# Patient Record
Sex: Male | Born: 1960 | Race: White | Hispanic: No | Marital: Married | Smoking: Never smoker
Health system: Southern US, Community
[De-identification: ages and names within clinical notes are randomized; demographics above are authoritative.]

## PROBLEM LIST (undated history)

## (undated) DIAGNOSIS — E785 Hyperlipidemia, unspecified: Secondary | ICD-10-CM

## (undated) DIAGNOSIS — E663 Overweight: Secondary | ICD-10-CM

## (undated) DIAGNOSIS — I10 Essential (primary) hypertension: Secondary | ICD-10-CM

## (undated) DIAGNOSIS — R454 Irritability and anger: Secondary | ICD-10-CM

## (undated) HISTORY — DX: Essential (primary) hypertension: I10

## (undated) HISTORY — DX: Irritability and anger: R45.4

## (undated) HISTORY — DX: Hyperlipidemia, unspecified: E78.5

## (undated) HISTORY — DX: Overweight: E66.3

---

## 2003-05-04 HISTORY — PX: HERNIA REPAIR: SHX51

## 2003-07-23 ENCOUNTER — Ambulatory Visit (HOSPITAL_COMMUNITY): Admission: RE | Admit: 2003-07-23 | Discharge: 2003-07-23 | Payer: Self-pay | Admitting: Surgery

## 2004-09-02 ENCOUNTER — Encounter: Admission: RE | Admit: 2004-09-02 | Discharge: 2004-09-02 | Payer: Self-pay | Admitting: Orthopedic Surgery

## 2004-11-05 ENCOUNTER — Ambulatory Visit: Payer: Self-pay | Admitting: Family Medicine

## 2004-11-30 ENCOUNTER — Ambulatory Visit: Payer: Self-pay | Admitting: Family Medicine

## 2004-12-09 ENCOUNTER — Ambulatory Visit: Payer: Self-pay | Admitting: Family Medicine

## 2004-12-17 ENCOUNTER — Ambulatory Visit: Payer: Self-pay | Admitting: Family Medicine

## 2005-01-28 ENCOUNTER — Ambulatory Visit: Payer: Self-pay | Admitting: Family Medicine

## 2005-04-26 ENCOUNTER — Ambulatory Visit: Payer: Self-pay | Admitting: Family Medicine

## 2005-06-21 ENCOUNTER — Ambulatory Visit: Payer: Self-pay | Admitting: Family Medicine

## 2005-10-28 ENCOUNTER — Ambulatory Visit: Payer: Self-pay | Admitting: Family Medicine

## 2005-12-30 ENCOUNTER — Ambulatory Visit: Payer: Self-pay | Admitting: Family Medicine

## 2006-02-01 ENCOUNTER — Ambulatory Visit: Payer: Self-pay | Admitting: Family Medicine

## 2006-02-08 ENCOUNTER — Ambulatory Visit: Payer: Self-pay | Admitting: Family Medicine

## 2006-02-08 LAB — CONVERTED CEMR LAB
ALT: 51 U/L — ABNORMAL HIGH
AST: 39 U/L — ABNORMAL HIGH
Albumin: 3.5 g/dL
Alkaline Phosphatase: 52 U/L
BUN: 15 mg/dL
Bilirubin, Direct: 0.1 mg/dL
CO2: 27 meq/L
Calcium: 8.9 mg/dL
Chloride: 109 meq/L
Cholesterol: 162 mg/dL
Creatinine, Ser: 0.9 mg/dL
GFR calc Af Amer: 118 mL/min
GFR calc non Af Amer: 97 mL/min
Glucose, Bld: 95 mg/dL
HDL: 29 mg/dL — ABNORMAL LOW
LDL Cholesterol: 105 mg/dL — ABNORMAL HIGH
Potassium: 3.9 meq/L
Sodium: 141 meq/L
TSH: 0.71 u[IU]/mL
Total Bilirubin: 0.6 mg/dL
Total CHOL/HDL Ratio: 5.6
Total Protein: 6.6 g/dL
Triglycerides: 142 mg/dL
VLDL: 28 mg/dL

## 2006-02-10 ENCOUNTER — Ambulatory Visit: Payer: Self-pay | Admitting: Family Medicine

## 2006-03-03 ENCOUNTER — Ambulatory Visit: Payer: Self-pay | Admitting: Family Medicine

## 2006-04-20 ENCOUNTER — Encounter: Payer: Self-pay | Admitting: Family Medicine

## 2006-04-20 DIAGNOSIS — I10 Essential (primary) hypertension: Secondary | ICD-10-CM | POA: Insufficient documentation

## 2006-04-20 DIAGNOSIS — T148XXA Other injury of unspecified body region, initial encounter: Secondary | ICD-10-CM | POA: Insufficient documentation

## 2006-04-20 DIAGNOSIS — K449 Diaphragmatic hernia without obstruction or gangrene: Secondary | ICD-10-CM | POA: Insufficient documentation

## 2006-04-20 DIAGNOSIS — R7303 Prediabetes: Secondary | ICD-10-CM

## 2006-04-20 DIAGNOSIS — R030 Elevated blood-pressure reading, without diagnosis of hypertension: Secondary | ICD-10-CM

## 2006-04-20 DIAGNOSIS — R454 Irritability and anger: Secondary | ICD-10-CM

## 2006-05-10 ENCOUNTER — Ambulatory Visit: Payer: Self-pay | Admitting: Family Medicine

## 2006-05-10 LAB — CONVERTED CEMR LAB
ALT: 25 units/L (ref 0–40)
AST: 23 units/L (ref 0–37)
Albumin: 4 g/dL (ref 3.5–5.2)
Alkaline Phosphatase: 60 units/L (ref 39–117)
Total Bilirubin: 0.9 mg/dL (ref 0.3–1.2)

## 2006-05-12 ENCOUNTER — Ambulatory Visit: Payer: Self-pay | Admitting: Family Medicine

## 2007-04-18 ENCOUNTER — Ambulatory Visit: Payer: Self-pay | Admitting: Family Medicine

## 2007-10-30 ENCOUNTER — Telehealth: Payer: Self-pay | Admitting: Family Medicine

## 2007-11-11 IMAGING — CR DG RIBS 2V*R*
1 series · 3 of 3 positions shown · non-contrast
Comparison: none

REASON FOR EXAM: INJURY (TELEPHONE RESULTS TO PHYSICIAN)
COMMENTS:

PROCEDURE:     DXR - DXR RIBS RIGHT UNILATERAL  - November 30, 2004 [DATE]
RESULT:         Evaluation of the RIGHT ribs demonstrates no evidence of
fracture, dislocation or malalignment.   RIGHT hemithorax appears
unremarkable.

[Series 1: view not recorded · 0.17mm/px · 3 of 3 slices shown]
[im 1/3]
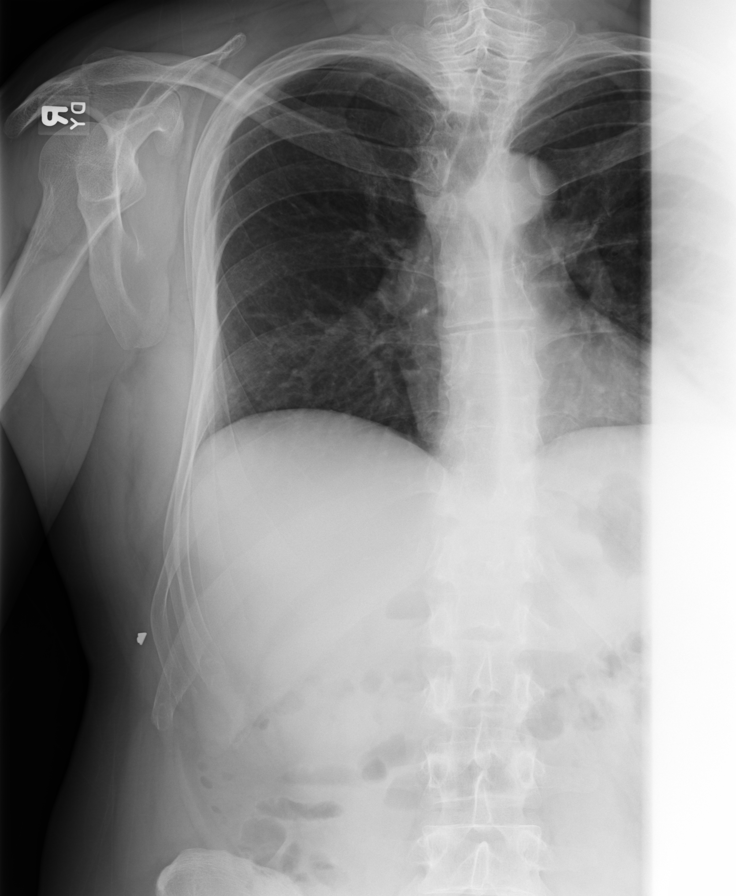
[im 2/3]
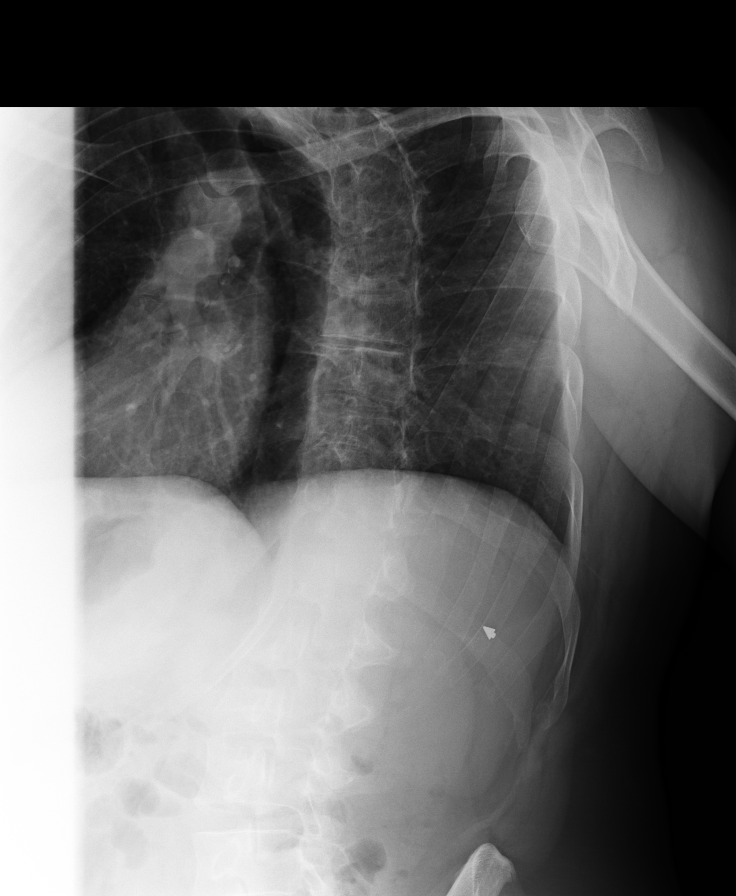
[im 3/3]
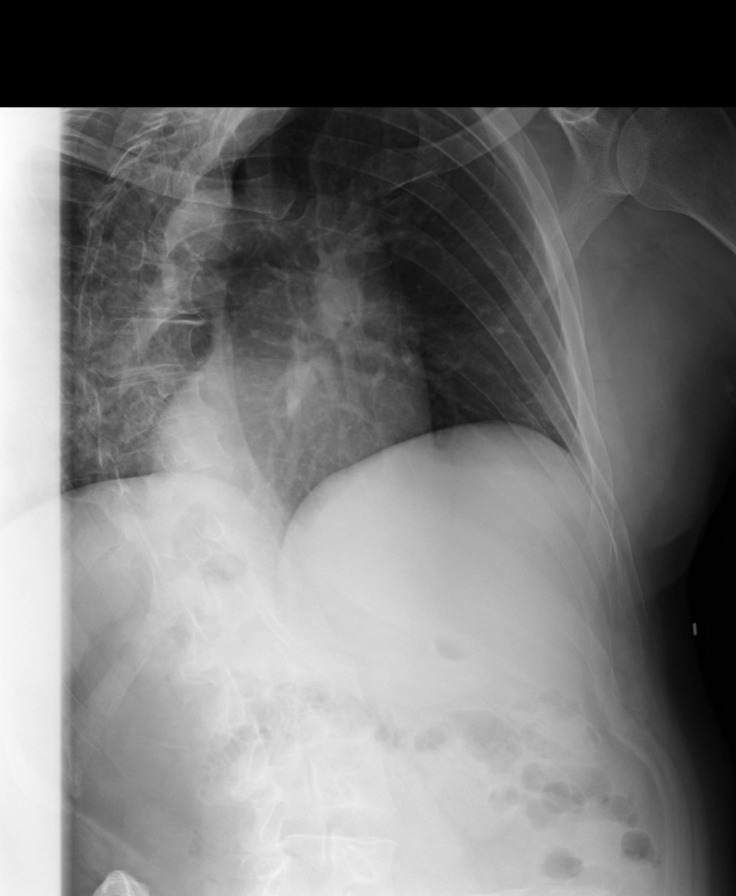

[3 of 3 positions shown; findings below may reference images not displayed]

IMPRESSION: 1.     Unremarkable unilateral RIGHT ribs.
2.     If there is persistent complaints of pain or persistent clinical
concern, a repeat evaluation in 7-10 days is recommended if clinically
warranted.

## 2007-12-19 ENCOUNTER — Telehealth: Payer: Self-pay | Admitting: Family Medicine

## 2008-05-09 ENCOUNTER — Telehealth (INDEPENDENT_AMBULATORY_CARE_PROVIDER_SITE_OTHER): Payer: Self-pay | Admitting: *Deleted

## 2008-05-21 ENCOUNTER — Ambulatory Visit: Payer: Self-pay | Admitting: Family Medicine

## 2008-05-21 LAB — CONVERTED CEMR LAB
Glucose, Urine, Semiquant: NEGATIVE
Ketones, urine, test strip: NEGATIVE
Specific Gravity, Urine: 1.02
Urobilinogen, UA: 0.2
WBC Urine, dipstick: NEGATIVE
pH: 6.5

## 2008-11-04 ENCOUNTER — Ambulatory Visit: Payer: Self-pay | Admitting: Family Medicine

## 2008-11-04 DIAGNOSIS — K5289 Other specified noninfective gastroenteritis and colitis: Secondary | ICD-10-CM

## 2008-11-21 ENCOUNTER — Telehealth: Payer: Self-pay | Admitting: Family Medicine

## 2008-11-26 ENCOUNTER — Ambulatory Visit: Payer: Self-pay | Admitting: Family Medicine

## 2008-11-26 DIAGNOSIS — N529 Male erectile dysfunction, unspecified: Secondary | ICD-10-CM

## 2009-01-15 ENCOUNTER — Telehealth: Payer: Self-pay | Admitting: Family Medicine

## 2009-08-11 ENCOUNTER — Encounter (INDEPENDENT_AMBULATORY_CARE_PROVIDER_SITE_OTHER): Payer: Self-pay | Admitting: *Deleted

## 2009-10-21 ENCOUNTER — Telehealth: Payer: Self-pay | Admitting: Family Medicine

## 2010-02-02 NOTE — Letter (Signed)
Summary: Nadara Eaton letter  [place] [facility]  [address] [place], [place] [address]   Phone: [phone]  Fax: [phone]       08/11/2009 MRN: [id number]  Samuel Harrington [address] [place], [place]  [address]  Dear Mr. Samuel Harrington [facility] [place] and [place] announce the retirement of Arta Silence, M.D., from full-time practice at the [facility] office effective July 02, 2009 and his plans of returning part-time.  It is important to Dr. Hetty Ely and to our practice that you understand that [facility] has seven physicians in our office for your health care needs.  We will continue to offer the same exceptional care that you have today.    Dr. Hetty Ely has spoken to many of you about his plans for retirement and returning part-time in the fall.   We will continue to work with you through the transition to schedule appointments for you in the office and meet the high standards that [place] is committed to.   Again, it is with great pleasure that we share the news that Dr. Hetty Ely will return to [facility] in October of 2011 with a reduced schedule.    If you have any questions, or would like to request an appointment with one of our physicians, please call us at [phone] and press the option for Scheduling an appointment.  We take pleasure in providing you with excellent patient care and look forward to seeing you at your next office visit.  Our [facility] Physicians are:  Tillman Abide, M.D. Laurita Quint, M.D. Roxy Manns, M.D. Kerby Nora, M.D. Hannah Beat, M.D. Ruthe Mannan, M.D. We proudly welcomed Raechel Ache, M.D. and Eustaquio Boyden, M.D. to the practice in July/August 2011.  Sincerely,  [place] [facility]

## 2010-02-02 NOTE — Letter (Signed)
Summary: Schaller Retirement letter  Samuel Harrington at [place]  [address]   [place], [place] [address]   Phone: [phone]  Fax: [phone]       08/11/2009 MRN: [id number]  [name] [address] [place], [place]  [address]  Dear [name],  [place] [facility] [place], and  announce the retirement of ROBERT N. SCHALLER, M.D., from full-time practice at the [place] office effective July 02, 2009 and his plans of returning part-time.  It is important to Dr. Schaller and to our practice that you understand that [place] [facility] [place] has seven physicians in our office for your health care needs.  We will continue to offer the same exceptional care that you have today.    Dr. Schaller has spoken to many of you about his plans for retirement and returning part-time in the fall.   We will continue to work with you through the transition to schedule appointments for you in the office and meet the high standards that [place] is committed to.   Again, it is with great pleasure that we share the news that Dr. Schaller will return to [facility] [place] in October of 2011 with a reduced schedule.    If you have any questions, or would like to request an appointment with one of our physicians, please call us at [phone] and press the option for Scheduling an appointment.  We take pleasure in providing you with excellent patient care and look forward to seeing you at your next office visit.  Our [place] Physicians are:  Richard Letvak, M.D. Robert Schaller, M.D. Marne Tower, M.D. Amy Bedsole, M.D. Spencer Copland, M.D. Talia Aron, M.D. We proudly welcomed Shaw Duncan, M.D. and Javier Gutierrez, M.D. to the practice in July/August 2011.  Sincerely,   [facility] [place] 

## 2010-02-02 NOTE — Progress Notes (Signed)
Summary: RX Viagra  Phone Note Call from Patient   Caller: Patient Call For: Shaune Leeks MD Summary of Call: Patient was in a few months and was given a rx for Viagra. Patient states that he has been checking around for the cheapest price and now has misplaced the rx and request that another be written so that he can pick it up.  Initial call taken by: Sydell Axon LPN,  January 15, 2009 10:48 AM  Follow-up for Phone Call        printed in put in nurse in box for pickup will refil times one in Dr Kathie Rhodes absence   Follow-up by: Judith Part MD,  January 15, 2009 11:38 AM  Additional Follow-up for Phone Call Additional follow up Details #1::        Advised pt ok to pick up. Additional Follow-up by: Lowella Petties CMA,  January 15, 2009 12:05 PM    Prescriptions: VIAGRA 100 MG TABS (SILDENAFIL CITRATE) one tab by mouth one hour prior to desired activity.  #10 x 0   Entered and Authorized by:   Judith Part MD   Signed by:   Judith Part MD on 01/15/2009   Method used:   Print then Give to Patient   RxID:   [id number]

## 2010-02-02 NOTE — Progress Notes (Signed)
Summary: refill request for viagra  Phone Note Refill Request Message from:  Fax from Pharmacy  Refills Requested: Medication #1:  VIAGRA 100 MG TABS one tab by mouth one hour prior to desired activity..   Last Refilled: 06/30/2009 Faxed request from [facility].  Initial call taken by: Lowella Petties CMA,  October 21, 2009 4:02 PM  Follow-up for Phone Call        Pls have pt schedule Comp Exam when able. No further meds until seen. Follow-up by: Shaune Leeks MD,  October 21, 2009 4:22 PM  Additional Follow-up for Phone Call Additional follow up Details #1::        Patient notified as instructed by telephone. Patient declined to schedule an appointment at this time and stated that he will have to call back to get this scheduled. Additional Follow-up by: Sydell Axon LPN,  October 22, 2009 2:52 PM    Prescriptions: VIAGRA 100 MG TABS (SILDENAFIL CITRATE) one tab by mouth one hour prior to desired activity.  #10 x 0   Entered and Authorized by:   Shaune Leeks MD   Signed by:   Shaune Leeks MD on 10/21/2009   Method used:   Electronically to        [facility] (retail)       [address] [place], [place]  [address]       Ph: [phone]       Fax: [phone]   RxID:   [id number]

## 2010-02-09 ENCOUNTER — Encounter: Payer: Self-pay | Admitting: Family Medicine

## 2010-03-31 ENCOUNTER — Ambulatory Visit (INDEPENDENT_AMBULATORY_CARE_PROVIDER_SITE_OTHER): Payer: PRIVATE HEALTH INSURANCE | Admitting: Family Medicine

## 2010-03-31 ENCOUNTER — Encounter: Payer: Self-pay | Admitting: Family Medicine

## 2010-03-31 DIAGNOSIS — Z0000 Encounter for general adult medical examination without abnormal findings: Secondary | ICD-10-CM

## 2010-03-31 DIAGNOSIS — I10 Essential (primary) hypertension: Secondary | ICD-10-CM

## 2010-03-31 DIAGNOSIS — IMO0002 Reserved for concepts with insufficient information to code with codable children: Secondary | ICD-10-CM

## 2010-03-31 DIAGNOSIS — K449 Diaphragmatic hernia without obstruction or gangrene: Secondary | ICD-10-CM

## 2010-03-31 DIAGNOSIS — R7989 Other specified abnormal findings of blood chemistry: Secondary | ICD-10-CM

## 2010-03-31 NOTE — Assessment & Plan Note (Signed)
Minmal sxs. Again wt loss will help.

## 2010-03-31 NOTE — Progress Notes (Signed)
Addended byMills Koller on: 03/31/2010 03:49 PM   Modules accepted: Orders

## 2010-03-31 NOTE — Patient Instructions (Signed)
Lose weight. Watch sweets and carbs.  Will report labs on phone tree.

## 2010-03-31 NOTE — Assessment & Plan Note (Signed)
Will recheck this PM randomly. Assume it will be high and require fasting BS with A1C.
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
## 2010-03-31 NOTE — Assessment & Plan Note (Signed)
Mildly elevated. Wt loss will help. He is committed to weight loss so will leave his BP medications alone. BP Readings from Last 3 Encounters:  03/31/10 120/90  11/26/08 120/72  11/04/08 124/88
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
## 2010-04-01 LAB — COMPREHENSIVE METABOLIC PANEL
AST: 30 U/L (ref 0–37)
Albumin: 4.3 g/dL (ref 3.5–5.2)
CO2: 28 mEq/L (ref 19–32)

## 2010-04-01 LAB — TSH: TSH: 1.32 u[IU]/mL (ref 0.35–5.50)

## 2010-04-01 LAB — LDL CHOLESTEROL, DIRECT: Direct LDL: 182.2 mg/dL

## 2010-04-01 LAB — LIPID PANEL: Total CHOL/HDL Ratio: 6

## 2010-05-21 NOTE — Op Note (Signed)
NAME:  Samuel Harrington, Samuel Harrington                        ACCOUNT NO.:  [id number]   MEDICAL RECORD NO.:  [id number]                   PATIENT TYPE:  AMB   LOCATION:  DAY                                  FACILITY[facility]  [facility]   PHYSICIAN:  Thornton Park. Daphine Deutscher, M.D.             DATE OF BIRTH:  1960/01/15   DATE OF PROCEDURE:  07/23/2003  DATE OF DISCHARGE:                                 OPERATIVE REPORT   PREOPERATIVE DIAGNOSES:  Bilateral inguinal hernias left greater than right.   POSTOPERATIVE DIAGNOSES:  Bilateral direct inguinal hernias left greater  than right.   PROCEDURE:  Laparoscopic preperitoneal dissection to reduce direct hernias,  repair with bard 3D max mesh (large).   DESCRIPTION OF PROCEDURE:  Mr. Nam Vossler was taken to room 11 on July 23, 2003 and given general anesthesia. The abdomen was prepped with Betadine  including the scrotum, testes and draped sterilely. A transverse incision  was made slightly to the left in the infraumbilical region. The anterior  rectus sheath was divided longitudinally in the anterior portion and the  muscle was retracted laterally.  With my fifth digit, I dissected along the  medial aspect of the rectus sheath down toward the pubis and then inserted  the balloon which I then deployed using a zero degree scope and got  dissection that dissect the rectus muscles apart a bit and seemed to get a  nice dissection in the preperitoneal space. The patient does have a moderate  amount of preperitoneal fat which obscured his pubis initially but I was  able to subsequently put two 5 mm trocars in and begin a dissection first on  the left side dissecting down a lot of preperitoneal fat.  This was done  using the epigastric vessels as an orientation and in doing so removed a  fatty mass that seemed to be within a fairly large direct sac that I could  see and when I separated it tended to retract up into the hernia defect. The  herniated material was brought  back into the preperitoneal space. I then  went out laterally and got as far as I could feel to the anterior iliac  spine and I got around the cord structures and skeletonized this and did not  see an indirect hernia. I was able to get around that.  I then went across  to the right side and dissected that area as well and went laterally.  He  did have a lot of fatty material showing some old inflammatory reaction  again much on the left as on the right and this was swept away laterally out  toward the anterior superior iliac spine.  I did not seem to get into the  abdominal cavity at all.  There was some leakage around the port site  proximally and I used some Vaseline gauze to try to help maintain a good  pneumo within the preperitoneal space.  The cord structures were again  identified and skeletonized. On the right side, I could see the femoral  vessels nicely.  After reducing the fat and demonstrating a direct hernia on  the right which was somewhat smaller, I then went ahead and repaired the  left side with a piece of large 3D max mesh. This did, because of the size  of his pelvis, seem to overlap the midline slightly.  I tacked the medial  and the pubis and then went down and tacked it with three tacks along the  pubis.  I laid it out laterally but did not tack it near the lateral most  portion but tacked it anteriorly in the midline.  Similarly a piece of right  3D max mesh was inserted and this seemed to again overlap the previously  placed mesh and I felt at this point his pelvis was somewhat smaller.  This  was taken out laterally as far as possible going over the cord structures  but mainly but both mesh generously covered the direct defect. These were  tacked again at this point anteriorly up the midline and anteriorly.  It  seemed to secure the mesh adequately and it seemed to cover the direct  defects nicely.  Both testes were palpated and were in good position.  There  was no  pneumoperitoneum in the scrotum at all and the palpable area that I  could palpate in the anterior abdominal wall corresponded to the direct  defect seen.  No bleeding was seen. I injected the port site with Marcaine.  I decompressed the abdomen and withdrew the trocars. The defect in the  midline rectus was repaired with a figure-of-eight suture of  #0 Vicryl.  The wounds were closed with #0 Vicryl, Benzoin and Steri-Strips.  The patient seemed to tolerate the procedure well and was taken to the  recovery room. He was given Percocet prescription to take for pain and will  be followed up in the office in 2-3 weeks.                                               Thornton Park Daphine Deutscher, M.D.    MBM/MEDQ  D:  07/23/2003  T:  07/23/2003  Job:  [id number]   cc:   Laurita Quint, M.D.  [address] [place]  [place] [address]  Fax: [phone]

## 2010-12-31 ENCOUNTER — Other Ambulatory Visit: Payer: Self-pay | Admitting: Family Medicine

## 2011-01-03 NOTE — Telephone Encounter (Signed)
Is it okay to refill medication? 

## 2011-01-07 ENCOUNTER — Telehealth: Payer: Self-pay | Admitting: Family Medicine

## 2011-01-07 NOTE — Telephone Encounter (Signed)
Wife requested refill for Viagra at [facility] and they said they have not received refill.  Please call back [phone]

## 2011-01-07 NOTE — Telephone Encounter (Signed)
Duplicate.  This was sent electronically on 12/31/10

## 2011-02-28 ENCOUNTER — Other Ambulatory Visit: Payer: Self-pay | Admitting: *Deleted

## 2011-02-28 MED ORDER — SERTRALINE HCL 100 MG PO TABS: ORAL_TABLET | ORAL | Status: DC

## 2011-02-28 NOTE — Telephone Encounter (Signed)
Asher Muir please schedule with Dr Sharen Hones to get pt established. Thank you.

## 2011-02-28 NOTE — Telephone Encounter (Signed)
Received faxed refill request, patient has not established with another physician here.  Please advise.

## 2011-02-28 NOTE — Telephone Encounter (Signed)
Will refil - please make appt to get him est with Dr D or Reece Agar

## 2011-03-01 ENCOUNTER — Other Ambulatory Visit: Payer: Self-pay | Admitting: *Deleted

## 2011-03-01 MED ORDER — SERTRALINE HCL 100 MG PO TABS: ORAL_TABLET | ORAL | Status: DC

## 2011-03-01 NOTE — Telephone Encounter (Signed)
Patient notified as instructed by telephone. Pt said he wanted med called to [facility] and pt will call back to establish with one of the new doctors.Medication phoned to [facility] pharmacy as instructed.

## 2011-03-01 NOTE — Telephone Encounter (Signed)
Patient called and stated that the Rx he requested for Zoloft was suppose to be called in for a 90 day supply and we only authorized one refill.  Ok to give additional refills?  Please advise.

## 2011-03-01 NOTE — Telephone Encounter (Signed)
L/M for pt to call back and schedule appt to est with Dr. Reece Agar

## 2011-03-01 NOTE — Telephone Encounter (Signed)
Sorry, he was Dr Lorenza Chick pt and I did not know this  # 90 is ok with 1 refil  Correct me if I'm wrong- last visit was 3/12 and he needs to est with a new doctor - that is why I did not give refils  Go ahead and give 90 with 1 ref and sched appt to est with new physician (Dr Reece Agar or D) when able  Thanks

## 2011-07-26 ENCOUNTER — Other Ambulatory Visit: Payer: Self-pay | Admitting: *Deleted

## 2011-07-27 MED ORDER — SERTRALINE HCL 100 MG PO TABS: ORAL_TABLET | ORAL | Status: DC

## 2011-07-27 NOTE — Telephone Encounter (Signed)
Have sent in.  Needs office visit /physical prior to more refills.

## 2011-08-18 ENCOUNTER — Encounter: Payer: Self-pay | Admitting: Family Medicine

## 2011-08-18 ENCOUNTER — Ambulatory Visit (INDEPENDENT_AMBULATORY_CARE_PROVIDER_SITE_OTHER): Payer: PRIVATE HEALTH INSURANCE | Admitting: Family Medicine

## 2011-08-18 VITALS — BP 134/82 | HR 80 | Temp 97.9°F | Ht 70.0 in | Wt 210.2 lb

## 2011-08-18 DIAGNOSIS — R7989 Other specified abnormal findings of blood chemistry: Secondary | ICD-10-CM

## 2011-08-18 DIAGNOSIS — E669 Obesity, unspecified: Secondary | ICD-10-CM | POA: Insufficient documentation

## 2011-08-18 DIAGNOSIS — Z1211 Encounter for screening for malignant neoplasm of colon: Secondary | ICD-10-CM

## 2011-08-18 DIAGNOSIS — N529 Male erectile dysfunction, unspecified: Secondary | ICD-10-CM

## 2011-08-18 DIAGNOSIS — E66811 Obesity, class 1: Secondary | ICD-10-CM | POA: Insufficient documentation

## 2011-08-18 DIAGNOSIS — R454 Irritability and anger: Secondary | ICD-10-CM

## 2011-08-18 DIAGNOSIS — IMO0002 Reserved for concepts with insufficient information to code with codable children: Secondary | ICD-10-CM

## 2011-08-18 DIAGNOSIS — I10 Essential (primary) hypertension: Secondary | ICD-10-CM

## 2011-08-18 DIAGNOSIS — E785 Hyperlipidemia, unspecified: Secondary | ICD-10-CM

## 2011-08-18 DIAGNOSIS — E663 Overweight: Secondary | ICD-10-CM

## 2011-08-18 MED ORDER — SERTRALINE HCL 100 MG PO TABS: ORAL_TABLET | ORAL | Status: DC

## 2011-08-18 MED ORDER — SILDENAFIL CITRATE 100 MG PO TABS: 100.0000 mg | ORAL_TABLET | ORAL | Status: DC | PRN

## 2011-08-18 NOTE — Patient Instructions (Signed)
Good to meet you today! Call us with questions. meds refilled today. Return at your convenience fasting for blood work, afterwards for physical. Pass by lab for stool kit for colon cancer screening.

## 2011-08-18 NOTE — Assessment & Plan Note (Signed)
Great control on SSRI - desires to continue.

## 2011-08-18 NOTE — Assessment & Plan Note (Signed)
Normal last blood work.

## 2011-08-18 NOTE — Assessment & Plan Note (Signed)
Will check next fasting blood work, discussed low chol diet.
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
## 2011-08-18 NOTE — Assessment & Plan Note (Signed)
Borderline in past.  Stable today off meds.

## 2011-08-18 NOTE — Assessment & Plan Note (Signed)
Chronic, stable. Refilled med.

## 2011-08-18 NOTE — Assessment & Plan Note (Signed)
Will continue to encourage weight loss through diet/exercise.

## 2012-04-10 ENCOUNTER — Ambulatory Visit (INDEPENDENT_AMBULATORY_CARE_PROVIDER_SITE_OTHER): Payer: Managed Care, Other (non HMO) | Admitting: Family Medicine

## 2012-04-10 ENCOUNTER — Encounter: Payer: Self-pay | Admitting: Family Medicine

## 2012-04-10 VITALS — BP 138/80 | HR 60 | Temp 98.2°F | Ht 69.0 in | Wt 214.2 lb

## 2012-04-10 DIAGNOSIS — E785 Hyperlipidemia, unspecified: Secondary | ICD-10-CM

## 2012-04-10 DIAGNOSIS — Z1211 Encounter for screening for malignant neoplasm of colon: Secondary | ICD-10-CM

## 2012-04-10 DIAGNOSIS — R61 Generalized hyperhidrosis: Secondary | ICD-10-CM | POA: Insufficient documentation

## 2012-04-10 DIAGNOSIS — I10 Essential (primary) hypertension: Secondary | ICD-10-CM

## 2012-04-10 DIAGNOSIS — R7989 Other specified abnormal findings of blood chemistry: Secondary | ICD-10-CM

## 2012-04-10 DIAGNOSIS — Z0000 Encounter for general adult medical examination without abnormal findings: Secondary | ICD-10-CM | POA: Insufficient documentation

## 2012-04-10 NOTE — Assessment & Plan Note (Signed)
Check blood work today.
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
## 2012-04-10 NOTE — Assessment & Plan Note (Signed)
Preventative protocols reviewed and updated unless pt declined. Discussed healthy diet and lifestyle.  

## 2012-04-10 NOTE — Assessment & Plan Note (Signed)
Recheck today. 

## 2012-04-10 NOTE — Assessment & Plan Note (Signed)
Stable off meds. ?

## 2012-04-10 NOTE — Patient Instructions (Addendum)
Blood work today. Tetanus shot will be due 2017. Good to see you today, call us with questions.

## 2012-04-11 LAB — COMPREHENSIVE METABOLIC PANEL
ALT: 37 U/L (ref 0–53)
Alkaline Phosphatase: 57 U/L (ref 39–117)
BUN: 11 mg/dL (ref 6–23)
CO2: 24 mEq/L (ref 19–32)
Calcium: 9 mg/dL (ref 8.4–10.5)
Creatinine, Ser: 0.8 mg/dL (ref 0.4–1.5)
Potassium: 3.7 mEq/L (ref 3.5–5.1)
Total Bilirubin: 0.7 mg/dL (ref 0.3–1.2)
Total Protein: 7 g/dL (ref 6.0–8.3)

## 2012-04-11 LAB — CBC WITH DIFFERENTIAL/PLATELET
Eosinophils Absolute: 0.1 10*3/uL (ref 0.0–0.7)
Eosinophils Relative: 1.3 % (ref 0.0–5.0)
Hemoglobin: 12.8 g/dL — ABNORMAL LOW (ref 13.0–17.0)
MCHC: 33.6 g/dL (ref 30.0–36.0)
Monocytes Absolute: 0.6 10*3/uL (ref 0.1–1.0)
Monocytes Relative: 7.4 % (ref 3.0–12.0)
Neutrophils Relative %: 64 % (ref 43.0–77.0)
RBC: 4.18 Mil/uL — ABNORMAL LOW (ref 4.22–5.81)
RDW: 12.7 % (ref 11.5–14.6)

## 2012-04-11 LAB — TSH: TSH: 0.69 u[IU]/mL (ref 0.35–5.50)

## 2012-04-11 LAB — LIPID PANEL
Total CHOL/HDL Ratio: 7
VLDL: 43.8 mg/dL — ABNORMAL HIGH (ref 0.0–40.0)

## 2012-04-12 ENCOUNTER — Encounter: Payer: Self-pay | Admitting: *Deleted

## 2012-07-23 ENCOUNTER — Telehealth: Payer: Self-pay | Admitting: Family Medicine

## 2012-07-23 NOTE — Telephone Encounter (Signed)
Message left for patient to return my call and schedule nurse visit. Advised there was only 1 nurse visit left for tomorrow, so he needed to call first thing in the morning to ensure that he got the appt scheduled for a Tdap per Dr. Reece Agar

## 2012-07-23 NOTE — Telephone Encounter (Signed)
Pt had left a voicemail about needing a tetanus.  Please call patient if okay to set up appt for nurse visit for tetanus.  [phone]
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
## 2012-07-24 NOTE — Telephone Encounter (Signed)
Message left for patient to return my call and schedule appt.

## 2012-08-30 ENCOUNTER — Other Ambulatory Visit: Payer: Self-pay | Admitting: Family Medicine

## 2012-09-01 ENCOUNTER — Other Ambulatory Visit: Payer: Self-pay | Admitting: Family Medicine

## 2012-11-06 ENCOUNTER — Ambulatory Visit (INDEPENDENT_AMBULATORY_CARE_PROVIDER_SITE_OTHER): Payer: Managed Care, Other (non HMO)

## 2012-11-06 DIAGNOSIS — Z23 Encounter for immunization: Secondary | ICD-10-CM

## 2012-11-22 ENCOUNTER — Telehealth: Payer: Self-pay

## 2012-11-22 NOTE — Telephone Encounter (Signed)
Pt called back requesting 04/2012 lab results to be picked up at our office by his wife. Notified pt lab results will be at front desk for pickup.

## 2012-11-22 NOTE — Telephone Encounter (Signed)
Mrs gras request copy of most recent labs for insurance purposes. Advised Mr Mckelvy needs to call to request; no DPR signed. Mrs Canter will have pt call our office.

## 2013-04-11 ENCOUNTER — Encounter: Payer: Managed Care, Other (non HMO) | Admitting: Family Medicine

## 2013-04-16 ENCOUNTER — Encounter: Payer: Self-pay | Admitting: Family Medicine

## 2013-04-16 ENCOUNTER — Ambulatory Visit (INDEPENDENT_AMBULATORY_CARE_PROVIDER_SITE_OTHER): Payer: Managed Care, Other (non HMO) | Admitting: Family Medicine

## 2013-04-16 VITALS — BP 122/88 | HR 80 | Temp 98.4°F | Ht 68.5 in | Wt 210.5 lb

## 2013-04-16 DIAGNOSIS — E663 Overweight: Secondary | ICD-10-CM

## 2013-04-16 DIAGNOSIS — Z23 Encounter for immunization: Secondary | ICD-10-CM

## 2013-04-16 DIAGNOSIS — E785 Hyperlipidemia, unspecified: Secondary | ICD-10-CM

## 2013-04-16 DIAGNOSIS — Z1211 Encounter for screening for malignant neoplasm of colon: Secondary | ICD-10-CM

## 2013-04-16 DIAGNOSIS — Z0000 Encounter for general adult medical examination without abnormal findings: Secondary | ICD-10-CM

## 2013-04-16 DIAGNOSIS — R7989 Other specified abnormal findings of blood chemistry: Secondary | ICD-10-CM

## 2013-04-16 DIAGNOSIS — I10 Essential (primary) hypertension: Secondary | ICD-10-CM

## 2013-04-16 DIAGNOSIS — R454 Irritability and anger: Secondary | ICD-10-CM

## 2013-04-16 MED ORDER — SERTRALINE HCL 100 MG PO TABS: ORAL_TABLET | ORAL | Status: DC

## 2013-04-16 NOTE — Progress Notes (Signed)
BP 122/88  Pulse 80  Temp(Src) 98.4 F (36.9 C) (Oral)  Ht 5' 8.5" (1.74 m)  Wt 210 lb 8 oz (95.482 kg)  BMI 31.54 kg/m2   CC: CPE  Subjective:    Patient ID: Samuel Harrington, male    DOB: [birth year], 53 y.o.   MRN: [id number]  HPI: Samuel Harrington is a 53 y.o. male presenting on 04/16/2013 for Annual Exam   Occasional night sweats - rare.  No chest pain, headaches, weight changes, palpitations or hypertension. Wt Readings from Last 3 Encounters:  04/16/13 210 lb 8 oz (95.482 kg)  04/10/12 214 lb 4 oz (97.183 kg)  08/18/11 210 lb 4 oz (95.369 kg)   Body mass index is 31.54 kg/(m^2).  Hep A started 2 yrs ago prior to trip to [place].  Will finish series today.  Lives with wife and daughter, 1 cat, outside dogs  Occupation: truck Theatre manager at night for [employer]  Activity: hikes, camp Diet: good water, fruits/vegetables daily   Preventative: Colon cancer screening - discussed, would like stool kit today. Did not bring last few. No BM changes, no bleeding.  Prostate screening - declines screening today. Would like to reassess in future. Denies nocturia or hesitancy or weak stream Tetanus 2007 Hep A 11/2012 Sunscreen use discussed Seat belt use discussed.  Relevant past medical, surgical, family and social history reviewed and updated as indicated.  Allergies and medications reviewed and updated. Current Outpatient Prescriptions on File Prior to Visit  Medication Sig  . VIAGRA 100 MG tablet TAKE 1 TABLET DAILY AS NEEDED   No current facility-administered medications on file prior to visit.    Review of Systems  Constitutional: Negative for fever, chills, activity change, appetite change, fatigue and unexpected weight change.  HENT: Negative for hearing loss.   Eyes: Negative for visual disturbance.  Respiratory: Negative for cough, chest tightness, shortness of breath and wheezing.   Cardiovascular: Negative for chest pain, palpitations and  leg swelling.  Gastrointestinal: Negative for nausea, vomiting, abdominal pain, diarrhea, constipation, blood in stool and abdominal distention.  Genitourinary: Negative for hematuria and difficulty urinating.  Musculoskeletal: Negative for arthralgias, myalgias and neck pain.  Skin: Negative for rash.  Neurological: Negative for dizziness, seizures, syncope and headaches.  Hematological: Negative for adenopathy. Does not bruise/bleed easily.  Psychiatric/Behavioral: Positive for dysphoric mood (moody). The patient is not nervous/anxious.    Per HPI unless specifically indicated above    Objective:    BP 122/88  Pulse 80  Temp(Src) 98.4 F (36.9 C) (Oral)  Ht 5' 8.5" (1.74 m)  Wt 210 lb 8 oz (95.482 kg)  BMI 31.54 kg/m2  Physical Exam  Nursing note and vitals reviewed. Constitutional: He is oriented to person, place, and time. He appears well-developed and well-nourished. No distress.  HENT:  Head: Normocephalic and atraumatic.  Right Ear: Hearing, tympanic membrane, external ear and ear canal normal.  Left Ear: Hearing, tympanic membrane, external ear and ear canal normal.  Nose: Nose normal.  Mouth/Throat: Uvula is midline, oropharynx is clear and moist and mucous membranes are normal. No oropharyngeal exudate, posterior oropharyngeal edema or posterior oropharyngeal erythema.  Eyes: Conjunctivae and EOM are normal. Pupils are equal, round, and reactive to light. No scleral icterus.  Neck: Normal range of motion. Neck supple. No thyromegaly present.  Cardiovascular: Normal rate, regular rhythm, normal heart sounds and intact distal pulses.   No murmur heard. Pulses:      Radial pulses are 2+  on the right side, and 2+ on the left side.  Pulmonary/Chest: Effort normal and breath sounds normal. No respiratory distress. He has no wheezes. He has no rales.  Abdominal: Soft. Bowel sounds are normal. He exhibits no distension and no mass. There is no tenderness. There is no rebound and  no guarding.  Musculoskeletal: Normal range of motion. He exhibits no edema.  Lymphadenopathy:    He has no cervical adenopathy.  Neurological: He is alert and oriented to person, place, and time.  CN grossly intact, station and gait intact  Skin: Skin is warm and dry. No rash noted.  Psychiatric: He has a normal mood and affect. His behavior is normal. Judgment and thought content normal.       Assessment & Plan:   Problem List Items Addressed This Visit   Irritability and anger     Some decreased libido / sexual dysfunction endorsed.  rec decrease sertraline to $RemoveBefor'100mg'[id number]$  daily.    HYPERTENSION     Stable today off med. Continue to monitor.Marland Kitchen    HYPERGLYCEMIA     Actually resolved last few readings.    HLD (hyperlipidemia)     FLP today. Reviewed low cholesterol diet recommendations.    Obesity     Body mass index is 31.54 kg/(m^2). Continue to encourage weight loss.    Healthcare maintenance - Primary     Preventative protocols reviewed and updated unless pt declined. Discussed healthy diet and lifestyle.     Relevant Orders      Lipid panel      Basic metabolic panel      CBC with Differential       Follow up plan: Return in about 1 year (around 04/17/2014), or as needed, for annual exam, prior fasting for blood work.

## 2013-04-16 NOTE — Assessment & Plan Note (Signed)
Preventative protocols reviewed and updated unless pt declined. Discussed healthy diet and lifestyle.  

## 2013-04-16 NOTE — Progress Notes (Signed)
Pre visit review using our clinic review tool, if applicable. No additional management support is needed unless otherwise documented below in the visit note. 

## 2013-04-16 NOTE — Assessment & Plan Note (Signed)
Some decreased libido / sexual dysfunction endorsed.  rec decrease sertraline to 100mg  daily.

## 2013-04-16 NOTE — Assessment & Plan Note (Signed)
Stable today off med. Continue to monitor..Marland Kitchen

## 2013-04-16 NOTE — Assessment & Plan Note (Addendum)
Body mass index is 31.54 kg/(m^2). Continue to encourage weight loss.

## 2013-04-16 NOTE — Assessment & Plan Note (Signed)
Actually resolved last few readings.

## 2013-04-16 NOTE — Addendum Note (Signed)
Addended by: Josph MachoANCE, Shawnda Mauney A on: 04/16/2013 04:52 PM   Modules accepted: Orders

## 2013-04-16 NOTE — Patient Instructions (Addendum)
Try decrease in sertraline to $RemoveBefor'100mg'[id number]$  daily (1 tablet daily). Hep A today - second shot in series. Stool kit and blood work today. Return as needed or in 1 years for next annual exam. For lowering cholesterol - fruits/vegetables, fiber, fish, and decreased added sugars.

## 2013-04-16 NOTE — Assessment & Plan Note (Signed)
FLP today. Reviewed low cholesterol diet recommendations.

## 2013-04-17 ENCOUNTER — Telehealth: Payer: Self-pay | Admitting: Family Medicine

## 2013-04-17 LAB — BASIC METABOLIC PANEL
BUN: 11 mg/dL (ref 6–23)
CHLORIDE: 101 meq/L (ref 96–112)
CO2: 30 mEq/L (ref 19–32)
CREATININE: 1 mg/dL (ref 0.4–1.5)
Calcium: 9.6 mg/dL (ref 8.4–10.5)
GFR: 87.46 mL/min (ref >60.00)
GLUCOSE: 80 mg/dL (ref 70–99)
POTASSIUM: 4.1 meq/L (ref 3.5–5.1)
SODIUM: 137 meq/L (ref 135–145)

## 2013-04-17 LAB — CBC WITH DIFFERENTIAL/PLATELET
BASOS ABS: 0 10*3/uL (ref 0.0–0.1)
Basophils Relative: 0.4 % (ref 0.0–3.0)
Eosinophils Absolute: 0.1 10*3/uL (ref 0.0–0.7)
Eosinophils Relative: 1.2 % (ref 0.0–5.0)
HEMATOCRIT: 42.8 % (ref 39.0–52.0)
Hemoglobin: 14.5 g/dL (ref 13.0–17.0)
LYMPHS ABS: 2.5 10*3/uL (ref 0.7–4.0)
LYMPHS PCT: 28.7 % (ref 12.0–46.0)
MCHC: 33.8 g/dL (ref 30.0–36.0)
MCV: 92.1 fl (ref 78.0–100.0)
MONO ABS: 0.7 10*3/uL (ref 0.1–1.0)
MONOS PCT: 7.7 % (ref 3.0–12.0)
Neutro Abs: 5.5 10*3/uL (ref 1.4–7.7)
Neutrophils Relative %: 62 % (ref 43.0–77.0)
Platelets: 284 10*3/uL (ref 150.0–400.0)
RBC: 4.65 Mil/uL (ref 4.22–5.81)
RDW: 12.6 % (ref 11.5–14.6)
WBC: 8.8 10*3/uL (ref 4.5–10.5)

## 2013-04-17 LAB — LIPID PANEL
CHOL/HDL RATIO: 7
CHOLESTEROL: 235 mg/dL — AB (ref 0–200)
HDL: 35.3 mg/dL — AB (ref >39.00)
LDL CALC: 144 mg/dL — AB (ref 0–99)
TRIGLYCERIDES: 277 mg/dL — AB (ref 0.0–149.0)
VLDL: 55.4 mg/dL — AB (ref 0.0–40.0)

## 2013-04-17 NOTE — Telephone Encounter (Signed)
Relevant patient education assigned to patient using Emmi. ° °

## 2013-09-05 ENCOUNTER — Encounter: Payer: Self-pay | Admitting: Family Medicine

## 2013-09-05 ENCOUNTER — Ambulatory Visit (INDEPENDENT_AMBULATORY_CARE_PROVIDER_SITE_OTHER): Payer: PRIVATE HEALTH INSURANCE | Admitting: Family Medicine

## 2013-09-05 VITALS — BP 124/72 | HR 70 | Temp 98.0°F | Wt 206.2 lb

## 2013-09-05 DIAGNOSIS — Z1211 Encounter for screening for malignant neoplasm of colon: Secondary | ICD-10-CM

## 2013-09-05 DIAGNOSIS — R454 Irritability and anger: Secondary | ICD-10-CM

## 2013-09-05 DIAGNOSIS — R6882 Decreased libido: Secondary | ICD-10-CM | POA: Insufficient documentation

## 2013-09-05 DIAGNOSIS — R55 Syncope and collapse: Secondary | ICD-10-CM | POA: Insufficient documentation

## 2013-09-05 LAB — CBC WITH DIFFERENTIAL/PLATELET
BASOS ABS: 0 10*3/uL (ref 0.0–0.1)
Basophils Relative: 0.3 % (ref 0.0–3.0)
EOS ABS: 0.1 10*3/uL (ref 0.0–0.7)
EOS PCT: 1.4 % (ref 0.0–5.0)
HCT: 41.9 % (ref 39.0–52.0)
HEMOGLOBIN: 14.1 g/dL (ref 13.0–17.0)
LYMPHS ABS: 1.9 10*3/uL (ref 0.7–4.0)
Lymphocytes Relative: 25.1 % (ref 12.0–46.0)
MCHC: 33.7 g/dL (ref 30.0–36.0)
MCV: 92.2 fl (ref 78.0–100.0)
Monocytes Absolute: 0.5 10*3/uL (ref 0.1–1.0)
Monocytes Relative: 6.9 % (ref 3.0–12.0)
NEUTROS ABS: 5 10*3/uL (ref 1.4–7.7)
NEUTROS PCT: 66.3 % (ref 43.0–77.0)
PLATELETS: 258 10*3/uL (ref 150.0–400.0)
RBC: 4.55 Mil/uL (ref 4.22–5.81)
RDW: 13 % (ref 11.5–15.5)
WBC: 7.6 10*3/uL (ref 4.0–10.5)

## 2013-09-05 LAB — BASIC METABOLIC PANEL
BUN: 16 mg/dL (ref 6–23)
CHLORIDE: 107 meq/L (ref 96–112)
CO2: 25 mEq/L (ref 19–32)
Calcium: 9.3 mg/dL (ref 8.4–10.5)
Creatinine, Ser: 0.9 mg/dL (ref 0.4–1.5)
GFR: 92.89 mL/min (ref >60.00)
Glucose, Bld: 99 mg/dL (ref 70–99)
Potassium: 4.2 mEq/L (ref 3.5–5.1)
SODIUM: 138 meq/L (ref 135–145)

## 2013-09-05 LAB — TSH: TSH: 0.33 u[IU]/mL — ABNORMAL LOW (ref 0.35–4.50)

## 2013-09-05 NOTE — Progress Notes (Signed)
Pre visit review using our clinic review tool, if applicable. No additional management support is needed unless otherwise documented below in the visit note. 

## 2013-09-05 NOTE — Assessment & Plan Note (Deleted)
Monitor on lower sertraline dose.

## 2013-09-05 NOTE — Patient Instructions (Signed)
Let's watch mood and libido on lower sertraline dose of 156m daily for next 3-4 weeks. If persistent trouble, let me know - may try lower sertraline dose to 512mdaily. EKG today. If any repeated passing out, please let me know right away. I think these episodes have been from dehydration - carry water bottle with you when at work especially on hot days to make sure you are staying well hydrated. Good to see you today, call us[place]oreaith questions. Please return stool kit - sent home today. Blood work today.

## 2013-09-05 NOTE — Assessment & Plan Note (Signed)
Monitor on lower sertraline dose of  daily.

## 2013-09-05 NOTE — Progress Notes (Signed)
   BP 124/72  Pulse 70  Temp(Src) 98 F (36.7 C) (Oral)  Wt 206 lb 4 oz (93.554 kg)  SpO2 96%   CC: discuss meds  Subjective:    Patient ID: Samuel Harrington, male    DOB: 02/20/1960, 53 y.o.   MRN: [id number]  HPI: Samuel Harrington is a 53 y.o. male presenting on 09/05/2013 for Discuss Medications   On sertraline for last 5+ years for irritability/anger. Last visit we decreased sertraine to  daily 2/2 decreased libido/sexual dysfunction (but no erectile dysfunction).  Actually didn't decrease sertraline until last 2-3 days.   05/21/2013 - episode of syncope attributed to severe dehydration from diarrhea. Evaluated at hospital in [place].  1 month ago after long hot day in car, got home, had dinner, went to bathroom to stool, then when he stood up passed out in bathroom. No injury. Both episodes happened after stooling and standing up - both episodes happened around periods of dehydration.  Endorses occasional dizziness. Wonders if zoloft related.  Relevant past medical, surgical, family and social history reviewed and updated as indicated.  Allergies and medications reviewed and updated. Current Outpatient Prescriptions on File Prior to Visit  Medication Sig  . sertraline (ZOLOFT) 100 MG tablet Take 100 mg by mouth at bedtime.  Marland Kitchen VIAGRA 100 MG tablet TAKE 1 TABLET DAILY AS NEEDED   No current facility-administered medications on file prior to visit.    Review of Systems Per HPI unless specifically indicated above    Objective:    BP 124/72  Pulse 70  Temp(Src) 98 F (36.7 C) (Oral)  Wt 206 lb 4 oz (93.554 kg)  SpO2 96%  Physical Exam  Nursing note and vitals reviewed. Constitutional: He appears well-developed and well-nourished. No distress.  HENT:  Mouth/Throat: Oropharynx is clear and moist. No oropharyngeal exudate.  Neck: Normal range of motion. Neck supple. Carotid bruit is not present.  Cardiovascular: Normal rate, regular rhythm, normal heart sounds and  intact distal pulses.   No murmur heard. Pulmonary/Chest: Effort normal and breath sounds normal. No respiratory distress. He has no wheezes. He has no rales.  Musculoskeletal: He exhibits no edema.  Skin: Skin is warm and dry. No rash noted.  Psychiatric: He has a normal mood and affect.       Assessment & Plan:   Problem List Items Addressed This Visit   Syncope - Primary     Anticipate vasovagal with dehydration contributing. Encouraged good hydration status throughout the day. Check EKG and TSH, CBC today. Pt to update me if recurrent syncope for further evaluation. EKG today - sinus bradycardia 50s, normal axis, intervals, no acute ST/T changes    Relevant Orders      EKG 12-Lead (Completed)      TSH      CBC with Differential      Basic metabolic panel   Irritability and anger     Monitor on lower sertraline dose of  daily.    Decreased libido     Monitor on lower sertraline dose. Thought SSRI related.     Other Visit Diagnoses   Special screening for malignant neoplasms, colon        Relevant Orders       Fecal occult blood, imunochemical        Follow up plan: Return if symptoms worsen or fail to improve.

## 2013-09-05 NOTE — Assessment & Plan Note (Addendum)
Anticipate vasovagal with dehydration contributing. Encouraged good hydration status throughout the day. Check EKG and TSH, CBC today. Pt to update me if recurrent syncope for further evaluation. EKG today - sinus bradycardia 50s, normal axis, intervals, no acute ST/T changes

## 2013-09-05 NOTE — Assessment & Plan Note (Signed)
Monitor on lower sertraline dose. Thought SSRI related.

## 2013-09-07 ENCOUNTER — Other Ambulatory Visit: Payer: Self-pay | Admitting: Family Medicine

## 2013-09-07 DIAGNOSIS — R7989 Other specified abnormal findings of blood chemistry: Secondary | ICD-10-CM

## 2013-09-11 ENCOUNTER — Encounter: Payer: Self-pay | Admitting: Family Medicine

## 2013-09-26 ENCOUNTER — Other Ambulatory Visit (INDEPENDENT_AMBULATORY_CARE_PROVIDER_SITE_OTHER): Payer: PRIVATE HEALTH INSURANCE

## 2013-09-26 DIAGNOSIS — R7989 Other specified abnormal findings of blood chemistry: Secondary | ICD-10-CM

## 2013-09-26 DIAGNOSIS — R946 Abnormal results of thyroid function studies: Secondary | ICD-10-CM

## 2013-09-26 LAB — T4, FREE: Free T4: 0.71 ng/dL (ref 0.60–1.60)

## 2013-09-26 LAB — TSH: TSH: 0.91 u[IU]/mL (ref 0.35–4.50)

## 2013-09-27 LAB — T3: T3, Total: 109.3 ng/dL (ref 80.0–204.0)

## 2014-01-01 ENCOUNTER — Other Ambulatory Visit: Payer: Self-pay | Admitting: Family Medicine

## 2014-04-05 ENCOUNTER — Emergency Department
Admit: 2014-04-05 | Disposition: A | Discharge status: Home / Self Care | Payer: Self-pay | Admitting: Physician Assistant

## 2014-04-22 ENCOUNTER — Encounter: Payer: Managed Care, Other (non HMO) | Admitting: Family Medicine

## 2014-04-22 ENCOUNTER — Telehealth: Payer: Self-pay | Admitting: Family Medicine

## 2014-04-22 DIAGNOSIS — Z0289 Encounter for other administrative examinations: Secondary | ICD-10-CM

## 2014-04-22 NOTE — Telephone Encounter (Signed)
Patient did not come in for their appointment today for cpe and labs.  Please let me know if patient needs to be contacted immediately for follow up or no follow up needed.

## 2014-04-22 NOTE — Telephone Encounter (Signed)
Would offer he reschedule CPE

## 2014-04-23 NOTE — Telephone Encounter (Signed)
Called to r/s appt. , phone is not accepting incoming calls.

## 2014-04-29 NOTE — Telephone Encounter (Signed)
Called to r/s pt, not a working number

## 2014-04-30 ENCOUNTER — Other Ambulatory Visit: Payer: Self-pay | Admitting: *Deleted

## 2014-04-30 MED ORDER — SILDENAFIL CITRATE 100 MG PO TABS: 100.0000 mg | ORAL_TABLET | Freq: Every day | ORAL | Status: DC | PRN

## 2014-06-04 ENCOUNTER — Other Ambulatory Visit: Payer: Self-pay | Admitting: Family Medicine

## 2014-07-25 ENCOUNTER — Other Ambulatory Visit: Payer: Self-pay | Admitting: Family Medicine

## 2014-08-05 ENCOUNTER — Ambulatory Visit: Payer: PRIVATE HEALTH INSURANCE | Admitting: Family Medicine

## 2014-08-07 ENCOUNTER — Other Ambulatory Visit: Payer: Self-pay | Admitting: *Deleted

## 2014-08-07 ENCOUNTER — Ambulatory Visit: Payer: PRIVATE HEALTH INSURANCE | Admitting: Family Medicine

## 2014-08-07 MED ORDER — SERTRALINE HCL 100 MG PO TABS: 100.0000 mg | ORAL_TABLET | Freq: Every day | ORAL | Status: DC

## 2014-08-18 ENCOUNTER — Ambulatory Visit: Payer: PRIVATE HEALTH INSURANCE | Admitting: Family Medicine

## 2014-08-21 ENCOUNTER — Encounter: Payer: Self-pay | Admitting: Family Medicine

## 2014-08-21 ENCOUNTER — Encounter (INDEPENDENT_AMBULATORY_CARE_PROVIDER_SITE_OTHER): Payer: Self-pay

## 2014-08-21 ENCOUNTER — Ambulatory Visit (INDEPENDENT_AMBULATORY_CARE_PROVIDER_SITE_OTHER): Payer: PRIVATE HEALTH INSURANCE | Admitting: Family Medicine

## 2014-08-21 VITALS — BP 138/82 | HR 72 | Temp 97.8°F | Wt 214.0 lb

## 2014-08-21 DIAGNOSIS — R454 Irritability and anger: Secondary | ICD-10-CM

## 2014-08-21 DIAGNOSIS — I10 Essential (primary) hypertension: Secondary | ICD-10-CM

## 2014-08-21 DIAGNOSIS — Z1211 Encounter for screening for malignant neoplasm of colon: Secondary | ICD-10-CM

## 2014-08-21 DIAGNOSIS — G4710 Hypersomnia, unspecified: Secondary | ICD-10-CM

## 2014-08-21 DIAGNOSIS — E669 Obesity, unspecified: Secondary | ICD-10-CM

## 2014-08-21 DIAGNOSIS — R030 Elevated blood-pressure reading, without diagnosis of hypertension: Secondary | ICD-10-CM

## 2014-08-21 DIAGNOSIS — R400 Somnolence: Secondary | ICD-10-CM | POA: Insufficient documentation

## 2014-08-21 DIAGNOSIS — R7989 Other specified abnormal findings of blood chemistry: Secondary | ICD-10-CM

## 2014-08-21 DIAGNOSIS — E785 Hyperlipidemia, unspecified: Secondary | ICD-10-CM | POA: Diagnosis not present

## 2014-08-21 MED ORDER — SERTRALINE HCL 50 MG PO TABS: 75.0000 mg | ORAL_TABLET | Freq: Every day | ORAL | Status: DC

## 2014-08-21 NOTE — Assessment & Plan Note (Signed)
Does not meet criteria for MDD, GAD. Pt hesitant to come off sertraline, agrees to continue decreasing dose to  daily - sent new dose in.

## 2014-08-21 NOTE — Assessment & Plan Note (Signed)
Encouraged healthy lifestyle changes. 

## 2014-08-21 NOTE — Progress Notes (Signed)
BP 138/82 mmHg  Pulse 72  Temp(Src) 97.8 F (36.6 C) (Oral)  Wt 214 lb (97.07 kg)  SpO2 99%   CC: med refill visit  Subjective:    Patient ID: Samuel Harrington, male    DOB: [birth year], 54 y.o.   MRN: [id number]  HPI: Samuel Harrington is a 54 y.o. male presenting on 08/21/2014 for Follow-up   Last seen here 09/2013. On sertraline for 5+ years for irritability/anger. Amenable to lower dose trial.   Daytime somnolence - with snoring. Easily falls asleep. No apneic episodes. No PNDyspnea. Mostly restorative sleep.   Lives with wife and daughter, 1 cat, outside dogs Occupation: promoted to Actuary at [employer] Activity: hikes, camp Diet: good water, fruits/vegetables daily  Relevant past medical, surgical, family and social history reviewed and updated as indicated. Interim medical history since our last visit reviewed. Allergies and medications reviewed and updated. Current Outpatient Prescriptions on File Prior to Visit  Medication Sig  . sildenafil (VIAGRA) 100 MG tablet Take 1 tablet (100 mg total) by mouth daily as needed.   No current facility-administered medications on file prior to visit.    Review of Systems Per HPI unless specifically indicated above     Objective:    BP 138/82 mmHg  Pulse 72  Temp(Src) 97.8 F (36.6 C) (Oral)  Wt 214 lb (97.07 kg)  SpO2 99%  Wt Readings from Last 3 Encounters:  08/21/14 214 lb (97.07 kg)  09/05/13 206 lb 4 oz (93.554 kg)  04/16/13 210 lb 8 oz (95.482 kg)   Body mass index is 32.06 kg/(m^2).  Physical Exam  Constitutional: He appears well-developed and well-nourished. No distress.  HENT:  Head: Normocephalic and atraumatic.  Mouth/Throat: Oropharynx is clear and moist. No oropharyngeal exudate.  Eyes: Conjunctivae and EOM are normal. Pupils are equal, round, and reactive to light. No scleral icterus.  Neck: Normal range of motion. Neck supple. No thyromegaly present.  Cardiovascular: Normal rate, regular rhythm,  normal heart sounds and intact distal pulses.   No murmur heard. Pulmonary/Chest: Effort normal and breath sounds normal. No respiratory distress. He has no wheezes. He has no rales.  Musculoskeletal: He exhibits no edema.  Skin: Skin is warm and dry. No rash noted.  Psychiatric: He has a normal mood and affect.  Nursing note and vitals reviewed.  Results for orders placed or performed in visit on 09/26/13  TSH  Result Value Ref Range   TSH 0.91 0.35 - 4.50 uIU/mL  T3  Result Value Ref Range   T3, Total 109.3 80.0 - 204.0 ng/dL  T4, free  Result Value Ref Range   Free T4 0.71 0.60 - 1.60 ng/dL      Assessment & Plan:  We have given pt 4 different hemoccult stool tests to complete since 2013 - has not completed any. Discussed with patient. Agrees to complete one this year - provided with iFOB. Problem List Items Addressed This Visit    Irritability and anger    Does not meet criteria for MDD, GAD. Pt hesitant to come off sertraline, agrees to continue decreasing dose to 75mg  daily - sent new dose in.      Elevated blood pressure reading - Primary    Actually stable readings today off meds - continue to monitor.      HLD (hyperlipidemia)    FLP today.       Relevant Orders   Lipid panel   Obesity, Class I, BMI 30-34.9    Encouraged  healthy lifestyle changes      Daytime somnolence    Daytime somnolence and snoring without witnessed apneic episodes. ESS = 18. Agrees to referral to pulm for further eval of sleep apnea.       Other Visit Diagnoses    Special screening for malignant neoplasms, colon        Relevant Orders    Fecal occult blood, imunochemical    Abnormal TSH        Relevant Orders    TSH    T4, free        Follow up plan: Return in about 10 months (around 06/21/2015), or as needed, for annual exam, prior fasting for blood work.

## 2014-08-21 NOTE — Assessment & Plan Note (Signed)
Daytime somnolence and snoring without witnessed apneic episodes. ESS = 18. Agrees to referral to pulm for further eval of sleep apnea.

## 2014-08-21 NOTE — Assessment & Plan Note (Signed)
FLP today 

## 2014-08-21 NOTE — Progress Notes (Signed)
Pre visit review using our clinic review tool, if applicable. No additional management support is needed unless otherwise documented below in the visit note. 

## 2014-08-21 NOTE — Assessment & Plan Note (Signed)
Actually stable readings today off meds - continue to monitor.

## 2014-08-21 NOTE — Patient Instructions (Addendum)
Pass by lab for stool kit.  labwork today. Let's try lower sertraline dose ($RemoveBeforeDE'75mg'[id number]$  daily) let us know how you do on lower dose. Return as needed or in 10-12 months for physical. Good to see you today, call us with questions. Sleep questionairre today.

## 2014-08-22 LAB — LIPID PANEL
CHOLESTEROL: 221 mg/dL — AB (ref 0–200)
HDL: 30.9 mg/dL — AB (ref >39.00)
NonHDL: 190.45
TRIGLYCERIDES: 294 mg/dL — AB (ref 0.0–149.0)
Total CHOL/HDL Ratio: 7
VLDL: 58.8 mg/dL — AB (ref 0.0–40.0)

## 2014-08-22 LAB — BASIC METABOLIC PANEL
BUN: 13 mg/dL (ref 6–23)
CHLORIDE: 107 meq/L (ref 96–112)
CO2: 25 meq/L (ref 19–32)
CREATININE: 0.98 mg/dL (ref 0.40–1.50)
Calcium: 9.3 mg/dL (ref 8.4–10.5)
GFR: 84.96 mL/min (ref >60.00)
GLUCOSE: 84 mg/dL (ref 70–99)
POTASSIUM: 4.2 meq/L (ref 3.5–5.1)
Sodium: 141 mEq/L (ref 135–145)

## 2014-08-22 LAB — TSH: TSH: 1.66 u[IU]/mL (ref 0.35–4.50)

## 2014-08-22 LAB — T4, FREE: Free T4: 0.71 ng/dL (ref 0.60–1.60)

## 2014-08-22 LAB — LDL CHOLESTEROL, DIRECT: LDL DIRECT: 161 mg/dL

## 2014-08-25 ENCOUNTER — Encounter: Payer: Self-pay | Admitting: *Deleted

## 2014-09-09 ENCOUNTER — Encounter: Payer: Self-pay | Admitting: *Deleted

## 2014-09-09 ENCOUNTER — Encounter: Payer: PRIVATE HEALTH INSURANCE | Admitting: Internal Medicine

## 2014-09-09 NOTE — Progress Notes (Signed)
[facility] Consultation      Assessment and Plan:      Date: 09/09/2014  MRN# [id number] EVERTTE SONES 01/21/1960  Referring Physician:   AYDAN LEVITZ is a 54 y.o. old male seen in consultation for chief complaint of:   No chief complaint on file.   HPI:  The patient is a 54 year old male who is referred today for symptoms of excessive daytime sleepiness. He has a history of obesity and essential hypertension.    PMHX:   Past Medical History  Diagnosis Date  . Allergic rhinitis   . Hypertension   . Irritability and anger     on zoloft  . HLD (hyperlipidemia)   . Overweight(278.02)    Surgical Hx:  Past Surgical History  Procedure Laterality Date  . Hernia repair  05/05    Bilateral/Lap   Family Hx:  Family History  Problem Relation Age of Onset  . Hypertension Mother   . Hyperlipidemia Mother   . Diabetes Father   . Hypertension Father   . Hyperlipidemia Father   . Aneurysm Father     brain  . Cancer Maternal Uncle     rectal, stomach  . Thyroid disease Neg Hx    Social Hx:   Social History  Substance Use Topics  . Smoking status: Never Smoker   . Smokeless tobacco: Never Used  . Alcohol Use: No   Medication:   Current Outpatient Rx  Name  Route  Sig  Dispense  Refill  . sertraline (ZOLOFT) 50 MG tablet   Oral   Take 1.5 tablets (75 mg total) by mouth at bedtime.   135 tablet   3   . sildenafil (VIAGRA) 100 MG tablet   Oral   Take 1 tablet (100 mg total) by mouth daily as needed.   6 tablet   11     Rx has expired - unused refills remain       Allergies:  Aspirin and Naproxen  Review of Systems: Gen:  Denies  fever, sweats, chills HEENT: Denies blurred vision, double vision. bleeds, sore throat Cvc:  No dizziness, chest pain. Resp:   Denies cough or sputum porduction, shortness of breath Gi: Denies swallowing difficulty, stomach pain. Gu:  Denies bladder incontinence, burning urine Ext:   No Joint pain,  stiffness. Skin: No skin rash,  hives Endoc:  No polyuria, polydipsia. Psych: No depression, insomnia. Other:  All other systems were reviewed with the patient and were negative other that what is mentioned in the HPI.   Physical Examination:   VS: There were no vitals taken for this visit.  General Appearance: No distress  Neuro:without focal findings,  speech normal,  HEENT: PERRLA, EOM intact.   Pulmonary: normal breath sounds, No wheezing.  CardiovascularNormal S1,S2.  No m/r/g.   Abdomen: Benign, Soft, non-tender. Renal:  No costovertebral tenderness  GU:  No performed at this time. Endoc: No evident thyromegaly, no signs of acromegaly. Skin:   warm, no rashes, no ecchymosis  Extremities: normal, no cyanosis, clubbing.  Other findings:    LABORATORY PANEL:   CBC No results for input(s): WBC, HGB, HCT, PLT in the last 168 hours. ------------------------------------------------------------------------------------------------------------------  Chemistries  No results for input(s): NA, K, CL, CO2, GLUCOSE, BUN, CREATININE, CALCIUM, MG, AST, ALT, ALKPHOS, BILITOT in the last 168 hours.  Invalid input(s): GFRCGP ------------------------------------------------------------------------------------------------------------------  Cardiac Enzymes No results for input(s): TROPONINI in the last 168 hours. ------------------------------------------------------------  RADIOLOGY:  No results found.  Orders for this visit: No orders of the defined types were placed in this encounter.     Thank  you for the consultation and for allowing [facility] to assist in the care of your patient. Our recommendations are noted above.  Please contact us if we can be of further service.   Wells Guiles, MD.  Board Certified in Internal Medicine, Pulmonary Medicine, Critical Care Medicine, and Sleep Medicine.  [facility]  Number: [phone]  Santiago Glad, M.D.  Stephanie Acre, M.D.  Carolyne Fiscal, M.D  This encounter was created in error - please disregard.

## 2014-09-17 ENCOUNTER — Telehealth: Payer: Self-pay | Admitting: Family Medicine

## 2014-09-17 NOTE — Telephone Encounter (Signed)
Noted. Will see then.  

## 2014-09-17 NOTE — Telephone Encounter (Signed)
Pt has appt with Dr Reece Agar 09/18/14 at 4:45 pm.

## 2014-09-17 NOTE — Telephone Encounter (Signed)
PLEASE NOTE: All timestamps contained within this report are represented as [place] Standard Time. CONFIDENTIALTY NOTICE: This fax transmission is intended only for the addressee. It contains information that is legally privileged, confidential or otherwise protected from use or disclosure. If you are not the intended recipient, you are strictly prohibited from reviewing, disclosing, copying using or disseminating any of this information or taking any action in reliance on or regarding this information. If you have received this fax in error, please notify us immediately by telephone so that we can arrange for its return to [place]. Phone: [phone], Toll-Free: [phone], Fax: [phone] Page: 1 of 1 Call Id: [id number] [facility] Day - Client TELEPHONE ADVICE RECORD [facility] Patient Name: JANET DECESARE DOB: Aug 30, 1960 Initial Comment Caller states c/o skin lesion, has been exposed to MRSA Nurse Assessment Nurse: Elijah Birk, RN, Stark Bray Date/Time (Eastern Time): 09/17/2014 1:48:09 PM Confirm and document reason for call. If symptomatic, describe symptoms. ---Caller states he has an open area/ skin lesion on the back of his neck that is oozing - clear fluid. His granddaughter was in hospital with MRSA that started with a bug bite. He has been exposed to MRSA. No fever. Has the patient traveled out of the country within the last 30 days? ---Not Applicable Does the patient require triage? ---Yes Related visit to physician within the last 2 weeks? ---No Does the PT have any chronic conditions? (i.e. diabetes, asthma, etc.) ---No Guidelines Guideline Title Affirmed Question Affirmed Notes MRSA Exposure [1] EXPOSURE to MRSA AND [2] no skin infection symptoms (all triage questions negative) Final Disposition [facility] [place], RN, Lynda Comments Caller's outcome upgraded to be seen within 24 hours (not an option on guideline). Caller  states he was scheduled to be seen tomorrow afternoon which nurse feels is appropriate. Advised in the meantime to keep his open skin lesion covered, use good handwashing. His granddaughter is on an antibiotic. Disagree/Comply: Comply

## 2014-09-18 ENCOUNTER — Ambulatory Visit (INDEPENDENT_AMBULATORY_CARE_PROVIDER_SITE_OTHER): Payer: PRIVATE HEALTH INSURANCE | Admitting: Family Medicine

## 2014-09-18 ENCOUNTER — Encounter: Payer: Self-pay | Admitting: Family Medicine

## 2014-09-18 VITALS — BP 116/76 | HR 76 | Temp 98.1°F | Wt 220.2 lb

## 2014-09-18 DIAGNOSIS — E669 Obesity, unspecified: Secondary | ICD-10-CM

## 2014-09-18 DIAGNOSIS — L089 Local infection of the skin and subcutaneous tissue, unspecified: Secondary | ICD-10-CM

## 2014-09-18 DIAGNOSIS — E785 Hyperlipidemia, unspecified: Secondary | ICD-10-CM

## 2014-09-18 DIAGNOSIS — L989 Disorder of the skin and subcutaneous tissue, unspecified: Secondary | ICD-10-CM | POA: Insufficient documentation

## 2014-09-18 MED ORDER — MUPIROCIN CALCIUM 2 % EX CREA: 1.0000 "application " | TOPICAL_CREAM | Freq: Three times a day (TID) | CUTANEOUS | Status: DC

## 2014-09-18 NOTE — Assessment & Plan Note (Signed)
Unclear inciting lesion but looks like erosion that has become infected. Given MRSA exposure, will treat with mupirocin cream.  Nothing to drain or culture today. Discussed universal precautions for MRSA. Pt agrees with plan.

## 2014-09-18 NOTE — Assessment & Plan Note (Signed)
Discussed importance of weight loss to control chol levels.

## 2014-09-18 NOTE — Progress Notes (Signed)
Pre visit review using our clinic review tool, if applicable. No additional management support is needed unless otherwise documented below in the visit note. 

## 2014-09-18 NOTE — Patient Instructions (Addendum)
Decrease simple carbs and added sugars, eliminate trans fats, increase fiber and limit alcohol. All these changes together can drop triglycerides by almost 50%. Continue fruits/vegetables, increase legumes (nuts, beans, lentils).  For skin infection - treat with mupirocin topical antibiotic sent to pharmacy. Watch for draining pus, streaking redness, fever or not improving as expected. Return February for lab visit only to check cholesterol levels.

## 2014-09-18 NOTE — Assessment & Plan Note (Addendum)
Reviewed recent cholesterol levels. Strong fmhx dyslipidemia. No fmhx CAD/CVA. Will focus on healthy diet and lifestyle changes over next 3-4 months and recheck FLP in 02/2015. If persistently high, pt agrees to start antihyperlipidemic medication.

## 2014-09-18 NOTE — Progress Notes (Signed)
BP 116/76 mmHg  Pulse 76  Temp(Src) 98.1 F (36.7 C) (Oral)  Wt 220 lb 4 oz (99.905 kg)   CC: check wound on neck  Subjective:    Patient ID: Samuel Harrington, male    DOB: 1960-02-16, 54 y.o.   MRN: [id number]  HPI: Samuel Harrington is a 54 y.o. male presenting on 09/18/2014 for Wound Check   5-6 d ago pt noticed knot on posterior R upper back that was sore. Very erythematous. + draining clear liquid. Nausea/diarrhea this week.  Treated with hydrocortisone cream. No warm compresses. No ibuprofen/tylenol.   No fevers/chills.  MRSA exposure - 1yo grandchild recently with MRSA infection.   Relevant past medical, surgical, family and social history reviewed and updated as indicated. Interim medical history since our last visit reviewed. Allergies and medications reviewed and updated. Current Outpatient Prescriptions on File Prior to Visit  Medication Sig  . sertraline (ZOLOFT) 50 MG tablet Take 1.5 tablets (75 mg total) by mouth at bedtime.  . sildenafil (VIAGRA) 100 MG tablet Take 1 tablet (100 mg total) by mouth daily as needed.   No current facility-administered medications on file prior to visit.    Review of Systems Per HPI unless specifically indicated above     Objective:    BP 116/76 mmHg  Pulse 76  Temp(Src) 98.1 F (36.7 C) (Oral)  Wt 220 lb 4 oz (99.905 kg)  Wt Readings from Last 3 Encounters:  09/18/14 220 lb 4 oz (99.905 kg)  08/21/14 214 lb (97.07 kg)  09/05/13 206 lb 4 oz (93.554 kg)   Body mass index is 33 kg/(m^2).  Physical Exam  Constitutional: He appears well-developed and well-nourished. No distress.  Pulmonary/Chest: Breath sounds normal.  Skin: Skin is warm and dry. No rash noted. There is erythema.  Small erosion with dry granulation tissue R posterior to shoulder with mild surrounding erythema, mild puritis. No fluctuance, no drainage.   Nursing note and vitals reviewed.  Results for orders placed or performed in visit on 08/21/14  Lipid  panel  Result Value Ref Range   Cholesterol 221 (H) 0 - 200 mg/dL   Triglycerides 409.8 (H) 0.0 - 149.0 mg/dL   HDL 11.91 (L) >47.82 mg/dL   VLDL 95.6 (H) 0.0 - 21.3 mg/dL   Total CHOL/HDL Ratio 7    NonHDL 190.45   TSH  Result Value Ref Range   TSH 1.66 0.35 - 4.50 uIU/mL  T4, free  Result Value Ref Range   Free T4 0.71 0.60 - 1.60 ng/dL  Basic metabolic panel  Result Value Ref Range   Sodium 141 135 - 145 mEq/L   Potassium 4.2 3.5 - 5.1 mEq/L   Chloride 107 96 - 112 mEq/L   CO2 25 19 - 32 mEq/L   Glucose, Bld 84 70 - 99 mg/dL   BUN 13 6 - 23 mg/dL   Creatinine, Ser 0.86 0.40 - 1.50 mg/dL   Calcium 9.3 8.4 - 57.8 mg/dL   GFR 46.96 >29.52 mL/min  LDL cholesterol, direct  Result Value Ref Range   Direct LDL 161.0 mg/dL      Assessment & Plan:   Problem List Items Addressed This Visit    Dyslipidemia    Reviewed recent cholesterol levels. Strong fmhx dyslipidemia. No fmhx CAD/CVA. Will focus on healthy diet and lifestyle changes over next 3-4 months and recheck FLP in 02/2015. If persistently high, pt agrees to start antihyperlipidemic medication.      Obesity, Class I, BMI  30-34.9    Discussed importance of weight loss to control chol levels.      Skin lesion, infected - Primary    Unclear inciting lesion but looks like erosion that has become infected. Given MRSA exposure, will treat with mupirocin cream.  Nothing to drain or culture today. Discussed universal precautions for MRSA. Pt agrees with plan.          Follow up plan: Return if symptoms worsen or fail to improve.

## 2014-10-27 ENCOUNTER — Encounter: Payer: Self-pay | Admitting: Primary Care

## 2014-10-27 ENCOUNTER — Ambulatory Visit (INDEPENDENT_AMBULATORY_CARE_PROVIDER_SITE_OTHER): Payer: PRIVATE HEALTH INSURANCE | Admitting: Primary Care

## 2014-10-27 VITALS — BP 122/70 | HR 71 | Temp 97.6°F | Ht 69.0 in | Wt 223.8 lb

## 2014-10-27 DIAGNOSIS — R059 Cough, unspecified: Secondary | ICD-10-CM

## 2014-10-27 DIAGNOSIS — R05 Cough: Secondary | ICD-10-CM | POA: Diagnosis not present

## 2014-10-27 MED ORDER — DOXYCYCLINE HYCLATE 100 MG PO TABS
100.0000 mg | ORAL_TABLET | Freq: Two times a day (BID) | ORAL | Status: DC
Start: 2014-10-27 — End: 2015-11-16

## 2014-10-27 MED ORDER — HYDROCODONE-HOMATROPINE 5-1.5 MG/5ML PO SYRP: 5.0000 mL | ORAL_SOLUTION | Freq: Every evening | ORAL | Status: DC | PRN

## 2014-10-27 NOTE — Patient Instructions (Signed)
Start Doxycycline antibiotics. Take 1 tablet by mouth twice daily for 7 days.  You may take Robitussin DM for cough and chest congestion during the day. You may take Hycodan at bedtime as needed for cough and rest.  Increase consumption of fluids and get plenty of rest.  It was a pleasure meeting you!

## 2014-10-27 NOTE — Progress Notes (Signed)
Pre visit review using our clinic review tool, if applicable. No additional management support is needed unless otherwise documented below in the visit note. 

## 2014-10-27 NOTE — Progress Notes (Signed)
Subjective:    Patient ID: Samuel Harrington, male    DOB: May 20, 1960, 54 y.o.   MRN: [id number]  HPI  Mr. Mazzola is a 54 year old male who presents today with a chief complaint of cough. His cough is productive with green sputum. He also reports symptoms of headache, wheezing, nasal congestion, sore throat. Denies fevers, chills, nausea. His symptoms have been present for 10 days and become worse over the past several days. His cough is his most bothersome symptom and is worse at night. He's taken Nyquil without relief.   Review of Systems  Constitutional: Negative for fever and chills.  HENT: Positive for congestion, postnasal drip, rhinorrhea, sinus pressure and sore throat. Negative for ear pain.   Respiratory: Positive for cough. Negative for shortness of breath.   Cardiovascular: Negative for chest pain.       Past Medical History  Diagnosis Date  . Allergic rhinitis   . Hypertension   . Irritability and anger     on zoloft  . HLD (hyperlipidemia)   . Overweight(278.02)     Social History   Social History  . Marital Status: Married    Spouse Name: N/A  . Number of Children: 3  . Years of Education: N/A   Occupational History  . Truck Mudlogger    Social History Main Topics  . Smoking status: Never Smoker   . Smokeless tobacco: Never Used  . Alcohol Use: No  . Drug Use: No  . Sexual Activity: Not on file   Other Topics Concern  . Not on file   Social History Narrative   Lives with wife and daughter, 1 cat, outside dogs   Occupation: promoted to Actuary at [employer]   Activity: hikes, camp   Diet: good water, fruits/vegetables daily    Past Surgical History  Procedure Laterality Date  . Hernia repair  05/05    Bilateral/Lap    Family History  Problem Relation Age of Onset  . Hypertension Mother   . Hyperlipidemia Mother   . Diabetes Father   . Hypertension Father   . Hyperlipidemia Father   . Aneurysm Father     brain  . Cancer  Maternal Uncle     rectal, stomach  . Thyroid disease Neg Hx     Allergies  Allergen Reactions  . Aspirin Anaphylaxis  . Naproxen Anaphylaxis    Does ok with minimal ibuprofen    Current Outpatient Prescriptions on File Prior to Visit  Medication Sig Dispense Refill  . mupirocin cream (BACTROBAN) 2 % Apply 1 application topically 3 (three) times daily. 30 g 0  . sertraline (ZOLOFT) 50 MG tablet Take 1.5 tablets (75 mg total) by mouth at bedtime. 135 tablet 3  . sildenafil (VIAGRA) 100 MG tablet Take 1 tablet (100 mg total) by mouth daily as needed. 6 tablet 11   No current facility-administered medications on file prior to visit.    BP 122/70 mmHg  Pulse 71  Temp(Src) 97.6 F (36.4 C) (Oral)  Wt 223 lb 12.8 oz (101.515 kg)  SpO2 97%    Objective:   Physical Exam  Constitutional: He appears well-nourished.  HENT:  Right Ear: Tympanic membrane and ear canal normal.  Left Ear: Tympanic membrane and ear canal normal.  Nose: Nose normal. Right sinus exhibits no maxillary sinus tenderness and no frontal sinus tenderness. Left sinus exhibits no maxillary sinus tenderness and no frontal sinus tenderness.  Mouth/Throat: Oropharynx is clear and moist.  Eyes: Conjunctivae are normal. Pupils are equal, round, and reactive to light.  Neck: Neck supple.  Cardiovascular: Normal rate and regular rhythm.   Pulmonary/Chest: Effort normal. He has rhonchi in the right upper field and the left upper field.  Lymphadenopathy:    He has no cervical adenopathy.  Skin: Skin is warm and dry.          Assessment & Plan:  Acute Bronchitis:  Productive cough with green sputum x 10 days, worse over weekend. Exam with rhonchi to bilateral upper lobes. No decreased sounds/crackles or evidence of pneumonia upon exam. Due to duration and presentation, will start him on Doxycycline BID x 7 days. RX for Hycodan provided for rest and cough HS. Follow up if no improvement in 3-4 days.

## 2015-02-17 ENCOUNTER — Other Ambulatory Visit (INDEPENDENT_AMBULATORY_CARE_PROVIDER_SITE_OTHER): Payer: BLUE CROSS/BLUE SHIELD

## 2015-02-17 DIAGNOSIS — E785 Hyperlipidemia, unspecified: Secondary | ICD-10-CM

## 2015-02-17 LAB — LIPID PANEL
CHOL/HDL RATIO: 6
Cholesterol: 202 mg/dL — ABNORMAL HIGH (ref 0–200)
HDL: 35.6 mg/dL — AB (ref >39.00)
LDL Cholesterol: 140 mg/dL — ABNORMAL HIGH (ref 0–99)
NONHDL: 166.8
Triglycerides: 136 mg/dL (ref 0.0–149.0)
VLDL: 27.2 mg/dL (ref 0.0–40.0)

## 2015-02-19 ENCOUNTER — Encounter: Payer: Self-pay | Admitting: *Deleted

## 2015-08-14 ENCOUNTER — Other Ambulatory Visit: Payer: Self-pay | Admitting: Family Medicine

## 2015-08-17 NOTE — Telephone Encounter (Signed)
Pt is requesting a refill. Last filled on 08/21/14 #135 +3, last OV 09/18/14. Ok to refill?

## 2015-11-16 ENCOUNTER — Ambulatory Visit (INDEPENDENT_AMBULATORY_CARE_PROVIDER_SITE_OTHER): Payer: BLUE CROSS/BLUE SHIELD | Admitting: Family Medicine

## 2015-11-16 ENCOUNTER — Encounter: Payer: Self-pay | Admitting: Family Medicine

## 2015-11-16 VITALS — BP 130/70 | HR 83 | Wt 211.0 lb

## 2015-11-16 DIAGNOSIS — Z1211 Encounter for screening for malignant neoplasm of colon: Secondary | ICD-10-CM

## 2015-11-16 DIAGNOSIS — E669 Obesity, unspecified: Secondary | ICD-10-CM

## 2015-11-16 DIAGNOSIS — E785 Hyperlipidemia, unspecified: Secondary | ICD-10-CM

## 2015-11-16 DIAGNOSIS — R454 Irritability and anger: Secondary | ICD-10-CM

## 2015-11-16 DIAGNOSIS — Z23 Encounter for immunization: Secondary | ICD-10-CM

## 2015-11-16 MED ORDER — SERTRALINE HCL 50 MG PO TABS
75.0000 mg | ORAL_TABLET | Freq: Every day | ORAL | 3 refills | Status: DC
Start: 2015-11-16 — End: 2016-11-09

## 2015-11-16 NOTE — Progress Notes (Signed)
   BP 130/70   Pulse 83   Wt 211 lb (95.7 kg)   SpO2 96%   BMI 31.16 kg/m    CC: med refill visit Subjective:    Patient ID: Samuel Harrington, male    DOB: [birth year], 55 y.o.   MRN: [id number]  HPI: Samuel Harrington is a 55 y.o. male presenting on 11/16/2015 for Medication Refill (zoloft)   Last seen here 09/2013. On sertraline for 5+ years for irritability/anger. Last year we decreased to 75mg  daily and he has tolerated this dose. Did not tolerate on 50mg  dose.   Healthy diet changes - less fast food. Preparing lunch for work. Drinking water. Weight loss noted.   Relevant past medical, surgical, family and social history reviewed and updated as indicated. Interim medical history since our last visit reviewed. Allergies and medications reviewed and updated. Current Outpatient Prescriptions on File Prior to Visit  Medication Sig  . sildenafil (VIAGRA) 100 MG tablet Take 1 tablet (100 mg total) by mouth daily as needed.   No current facility-administered medications on file prior to visit.     Review of Systems Per HPI unless specifically indicated in ROS section     Objective:    BP 130/70   Pulse 83   Wt 211 lb (95.7 kg)   SpO2 96%   BMI 31.16 kg/m   Wt Readings from Last 3 Encounters:  11/16/15 211 lb (95.7 kg)  10/27/14 223 lb 12.8 oz (101.5 kg)  09/18/14 220 lb 4 oz (99.9 kg)    Physical Exam  Constitutional: He appears well-developed and well-nourished. No distress.  HENT:  Mouth/Throat: Oropharynx is clear and moist. No oropharyngeal exudate.  Cardiovascular: Normal rate, regular rhythm, normal heart sounds and intact distal pulses.   No murmur heard. Pulmonary/Chest: Effort normal and breath sounds normal. No respiratory distress. He has no wheezes. He has no rales.  Musculoskeletal: He exhibits no edema.  Psychiatric: He has a normal mood and affect.  Nursing note and vitals reviewed.  Results for orders placed or performed in visit on 02/17/15  Lipid  panel  Result Value Ref Range   Cholesterol 202 (H) 0 - 200 mg/dL   Triglycerides 782.9136.0 0.0 - 149.0 mg/dL   HDL 56.2135.60 (L) >30.86>39.00 mg/dL   VLDL 57.827.2 0.0 - 46.940.0 mg/dL   LDL Cholesterol 629140 (H) 0 - 99 mg/dL   Total CHOL/HDL Ratio 6    NonHDL 166.80       Assessment & Plan:  We have given pt 5 different hemoccult stool tests to complete since 2013 - has not completed any. Discussed with patient. Agrees to complete one this year - provided with iFOB. Problem List Items Addressed This Visit    Dyslipidemia    FLP improved on recheck. Will reassess next year.       Irritability and anger - Primary    Sertraline 75mg  daily refilled. Pt declines lower dose. Doing well on current dose.      Obesity, Class I, BMI 30-34.9    Weight loss noted. Reviewed healthy diet and lifestyle changes to affect sustainable weight loss.       Other Visit Diagnoses    Encounter for immunization       Relevant Orders   Flu Vaccine QUAD 36+ mos IM (Completed)       Follow up plan: Return in about 1 year (around 11/15/2016) for annual exam, prior fasting for blood work.  Eustaquio BoydenJavier Kevis Qu, MD

## 2015-11-16 NOTE — Assessment & Plan Note (Signed)
FLP improved on recheck. Will reassess next year.

## 2015-11-16 NOTE — Assessment & Plan Note (Signed)
Sertraline 75mg  daily refilled. Pt declines lower dose. Doing well on current dose.

## 2015-11-16 NOTE — Addendum Note (Signed)
Addended by: Eustaquio BoydenGUTIERREZ, Beronica [place] on: 11/16/2015 05:18 PM   Modules accepted: Orders

## 2015-11-16 NOTE — Patient Instructions (Addendum)
Flu shot today  Pass by lab for stool kit. Sertraline (zoloft) refilled today.  Return at your convenience in 2018 for physical.

## 2015-11-16 NOTE — Progress Notes (Signed)
Pre visit review using our clinic review tool, if applicable. No additional management support is needed unless otherwise documented below in the visit note. 211

## 2015-11-16 NOTE — Assessment & Plan Note (Signed)
Weight loss noted. Reviewed healthy diet and lifestyle changes to affect sustainable weight loss.

## 2016-11-09 ENCOUNTER — Other Ambulatory Visit: Payer: Self-pay | Admitting: Family Medicine

## 2017-02-23 ENCOUNTER — Telehealth: Payer: Self-pay | Admitting: Family Medicine

## 2017-02-23 ENCOUNTER — Other Ambulatory Visit: Payer: Self-pay | Admitting: Family Medicine

## 2017-02-23 NOTE — Telephone Encounter (Signed)
Left voicemail for pt to call the office back to schedule an OV before medication can be filled

## 2017-02-23 NOTE — Telephone Encounter (Signed)
Copied from CRM [id number]. Topic: Quick Communication - Rx Refill/Question >> Feb 23, 2017  1:16 PM Dariyon Urquilla, Marcos EkeSharamare E, NT wrote: Medication: sertraline (ZOLOFT) 50 MG tablet   Has the patient contacted their pharmacy? Yes    (Agent: If no, request that the patient contact the pharmacy for the refill.)   [facility]erred [facility] (with phone number or street [facility] [address] [address] [phone] (Phone) [phone] (Fax)     Agent: Please be advised that RX refills may take up to 3 business days. We ask that you follow-up with your pharmacy.

## 2017-02-28 ENCOUNTER — Ambulatory Visit (INDEPENDENT_AMBULATORY_CARE_PROVIDER_SITE_OTHER): Payer: BLUE CROSS/BLUE SHIELD | Admitting: Family Medicine

## 2017-02-28 ENCOUNTER — Encounter: Payer: Self-pay | Admitting: Family Medicine

## 2017-02-28 VITALS — BP 138/82 | HR 70 | Temp 97.8°F | Wt 222.5 lb

## 2017-02-28 DIAGNOSIS — R030 Elevated blood-pressure reading, without diagnosis of hypertension: Secondary | ICD-10-CM | POA: Diagnosis not present

## 2017-02-28 DIAGNOSIS — Z23 Encounter for immunization: Secondary | ICD-10-CM | POA: Diagnosis not present

## 2017-02-28 DIAGNOSIS — R454 Irritability and anger: Secondary | ICD-10-CM | POA: Diagnosis not present

## 2017-02-28 MED ORDER — SERTRALINE HCL 50 MG PO TABS: ORAL_TABLET | ORAL | 3 refills | Status: DC

## 2017-02-28 NOTE — Assessment & Plan Note (Signed)
Chronic, stable on sertraline 75mg  daily. Desires to continue at current dose. Has noticed increased trouble with trials of tapers in the past.  PHQ9 = 0 GAD7 = 2

## 2017-02-28 NOTE — Assessment & Plan Note (Signed)
Stable readings - advised he monitor this intermittently at home.

## 2017-02-28 NOTE — Patient Instructions (Addendum)
Flu shot today.  Zoloft refilled today.  Goal blood pressure is <140/90. Keep an eye on this at home and let us know if consistently elevated.  Pass by lab to pick up stool kit.  Return for physical in 6 months.

## 2017-02-28 NOTE — Progress Notes (Signed)
BP 138/82 (BP Location: Left Arm, Patient Position: Sitting, Cuff Size: Normal)   Pulse 70   Temp 97.8 F (36.6 C) (Oral)   Wt 222 lb 8 oz (100.9 kg)   SpO2 97%   BMI 32.86 kg/m    CC: med refill visit Subjective:    Patient ID: Samuel Harrington, male    DOB: 1960-07-26, 57 y.o.   MRN: [id number]  HPI: Samuel SellsJonathan L Huot is a 57 y.o. male presenting on 02/28/2017 for Medication Refill   DOT physical yearly. Last seen here 11/2015. On sertraline 75mg  daily for increased irritability. Doesn't do well with trial taper in the past. Denies significant trouble with depression or anxiety.  Noticing elevated BP readings at dentist.   States he will return for CPE in 6 months. Requests labs deferred until then.   Preventative: Overdue for physical.  Colon cancer screening - we have provided pt with 6 different stool kits, he has never completed. Agrees to repeat today.  Prostate cancer screening - no nocturia. No fmhx. Requests we defer until physical.  Flu shot today Non smoker  Alcohol -   Lives with wife and daughter, 1 cat, outside dogs Occupation: promoted to Actuarydistric manager at [employer]  Activity: hikes, camp Diet: good water, fruits/vegetables some  Relevant past medical, surgical, family and social history reviewed and updated as indicated. Interim medical history since our last visit reviewed. Allergies and medications reviewed and updated. Outpatient Medications Prior to Visit  Medication Sig Dispense Refill  . sildenafil (VIAGRA) 100 MG tablet Take 1 tablet (100 mg total) by mouth daily as needed. 6 tablet 11  . sertraline (ZOLOFT) 50 MG tablet TAKE 1.5 CTABLETS  DAILY AT BEDTIME 135 tablet 0   No facility-administered medications prior to visit.      Per HPI unless specifically indicated in ROS section below Review of Systems     Objective:    BP 138/82 (BP Location: Left Arm, Patient Position: Sitting, Cuff Size: Normal)   Pulse 70   Temp 97.8 F (36.6 C) (Oral)    Wt 222 lb 8 oz (100.9 kg)   SpO2 97%   BMI 32.86 kg/m   Wt Readings from Last 3 Encounters:  02/28/17 222 lb 8 oz (100.9 kg)  11/16/15 211 lb (95.7 kg)  10/27/14 223 lb 12.8 oz (101.5 kg)    Physical Exam  Constitutional: He appears well-developed and well-nourished. No distress.  HENT:  Mouth/Throat: Oropharynx is clear and moist. No oropharyngeal exudate.  Cardiovascular: Normal rate, regular rhythm, normal heart sounds and intact distal pulses.  No murmur heard. Pulmonary/Chest: Effort normal and breath sounds normal. No respiratory distress. He has no wheezes. He has no rales.  Musculoskeletal: He exhibits no edema.  Psychiatric: He has a normal mood and affect.  Nursing note and vitals reviewed.  Results for orders placed or performed in visit on 02/17/15  Lipid panel  Result Value Ref Range   Cholesterol 202 (H) 0 - 200 mg/dL   Triglycerides 409.8136.0 0.0 - 149.0 mg/dL   HDL 11.9135.60 (L) >47.82>39.00 mg/dL   VLDL 95.627.2 0.0 - 21.340.0 mg/dL   LDL Cholesterol 086140 (H) 0 - 99 mg/dL   Total CHOL/HDL Ratio 6    NonHDL 166.80       Assessment & Plan:   Problem List Items Addressed This Visit    Elevated blood pressure reading    Stable readings - advised he monitor this intermittently at home.      Irritability and  anger - Primary    Chronic, stable on sertraline 75mg  daily. Desires to continue at current dose. Has noticed increased trouble with trials of tapers in the past.  PHQ9 = 0 GAD7 = 2       Other Visit Diagnoses    Need for influenza vaccination       Relevant Orders   Flu Vaccine QUAD 6+ mos PF IM (Fluarix Quad PF) (Completed)       Meds ordered this encounter  Medications  . sertraline (ZOLOFT) 50 MG tablet    Sig: TAKE 1.5 TABLETS  DAILY AT BEDTIME    Dispense:  135 tablet    Refill:  3   Orders Placed This Encounter  Procedures  . Flu Vaccine QUAD 6+ mos PF IM (Fluarix Quad PF)    Follow up plan: Return in about 6 months (around 08/28/2017) for annual  exam, prior fasting for blood work.  Eustaquio Boyden, MD

## 2017-08-27 ENCOUNTER — Other Ambulatory Visit: Payer: Self-pay | Admitting: Family Medicine

## 2017-08-27 DIAGNOSIS — R739 Hyperglycemia, unspecified: Secondary | ICD-10-CM

## 2017-08-27 DIAGNOSIS — E785 Hyperlipidemia, unspecified: Secondary | ICD-10-CM

## 2017-08-27 DIAGNOSIS — Z125 Encounter for screening for malignant neoplasm of prostate: Secondary | ICD-10-CM

## 2017-08-27 DIAGNOSIS — Z1159 Encounter for screening for other viral diseases: Secondary | ICD-10-CM

## 2017-08-29 ENCOUNTER — Ambulatory Visit (INDEPENDENT_AMBULATORY_CARE_PROVIDER_SITE_OTHER): Payer: BLUE CROSS/BLUE SHIELD | Admitting: Family Medicine

## 2017-08-29 ENCOUNTER — Encounter: Payer: Self-pay | Admitting: Family Medicine

## 2017-08-29 ENCOUNTER — Other Ambulatory Visit (INDEPENDENT_AMBULATORY_CARE_PROVIDER_SITE_OTHER): Payer: BLUE CROSS/BLUE SHIELD

## 2017-08-29 VITALS — BP 146/92 | HR 56 | Temp 98.7°F | Ht 69.0 in | Wt 229.5 lb

## 2017-08-29 DIAGNOSIS — Z125 Encounter for screening for malignant neoplasm of prostate: Secondary | ICD-10-CM

## 2017-08-29 DIAGNOSIS — Z1159 Encounter for screening for other viral diseases: Secondary | ICD-10-CM | POA: Diagnosis not present

## 2017-08-29 DIAGNOSIS — E785 Hyperlipidemia, unspecified: Secondary | ICD-10-CM

## 2017-08-29 DIAGNOSIS — B9789 Other viral agents as the cause of diseases classified elsewhere: Secondary | ICD-10-CM

## 2017-08-29 DIAGNOSIS — J069 Acute upper respiratory infection, unspecified: Secondary | ICD-10-CM

## 2017-08-29 DIAGNOSIS — R739 Hyperglycemia, unspecified: Secondary | ICD-10-CM | POA: Diagnosis not present

## 2017-08-29 DIAGNOSIS — K429 Umbilical hernia without obstruction or gangrene: Secondary | ICD-10-CM | POA: Insufficient documentation

## 2017-08-29 LAB — BASIC METABOLIC PANEL
BUN: 13 mg/dL (ref 6–23)
CALCIUM: 8.9 mg/dL (ref 8.4–10.5)
CHLORIDE: 107 meq/L (ref 96–112)
CO2: 25 meq/L (ref 19–32)
CREATININE: 0.98 mg/dL (ref 0.40–1.50)
GFR: 84.01 mL/min (ref >60.00)
Glucose, Bld: 117 mg/dL — ABNORMAL HIGH (ref 70–99)
Potassium: 3.8 mEq/L (ref 3.5–5.1)
Sodium: 141 mEq/L (ref 135–145)

## 2017-08-29 LAB — LIPID PANEL
CHOL/HDL RATIO: 7
Cholesterol: 195 mg/dL (ref 0–200)
HDL: 29.2 mg/dL — ABNORMAL LOW (ref >39.00)
NONHDL: 165.52
Triglycerides: 289 mg/dL — ABNORMAL HIGH (ref 0.0–149.0)
VLDL: 57.8 mg/dL — AB (ref 0.0–40.0)

## 2017-08-29 LAB — LDL CHOLESTEROL, DIRECT: Direct LDL: 136 mg/dL

## 2017-08-29 LAB — PSA: PSA: 0.52 ng/mL (ref 0.10–4.00)

## 2017-08-29 LAB — HEMOGLOBIN A1C: HEMOGLOBIN A1C: 6.2 % (ref 4.6–6.5)

## 2017-08-29 MED ORDER — PROMETHAZINE-DM 6.25-15 MG/5ML PO SYRP: 5.0000 mL | ORAL_SOLUTION | Freq: Four times a day (QID) | ORAL | 0 refills | Status: DC | PRN
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
## 2017-08-29 NOTE — Assessment & Plan Note (Signed)
Day 3-4 with congestion and worsening cough (esp at night  Disc symptomatic care - see instructions on AVS  Will take guaifenesin DM during the day  Px promethazine DM for pm (or when not driving) with warning of sedation  Fluids/steam/rest Update if not starting to improve in a week or if worsening

## 2017-08-29 NOTE — Patient Instructions (Signed)
Drink lots of fluids Rest when you can   During the day - stick with guaifenesin DM At night = try the promethazine dm (stronger and more sedating)  Breathing steam helps cough and congestion   Update if not starting to improve in a week or if worsening

## 2017-08-29 NOTE — Assessment & Plan Note (Addendum)
1.5 cm -mild /asymptomatic and reducible  Disc role of wt gain in these /enc wt loss  Re assured  Pt does lift/push in his job Watch for pain or further enlargement and alert us

## 2017-08-30 LAB — HEPATITIS C ANTIBODY
HEP C AB: NONREACTIVE
SIGNAL TO CUT-OFF: 0.01 (ref <1.00)

## 2017-09-01 ENCOUNTER — Encounter: Payer: BLUE CROSS/BLUE SHIELD | Admitting: Family Medicine

## 2017-09-12 ENCOUNTER — Ambulatory Visit (INDEPENDENT_AMBULATORY_CARE_PROVIDER_SITE_OTHER): Payer: BLUE CROSS/BLUE SHIELD | Admitting: Family Medicine

## 2017-09-12 ENCOUNTER — Encounter: Payer: Self-pay | Admitting: Family Medicine

## 2017-09-12 VITALS — BP 150/100 | HR 66 | Temp 98.0°F | Ht 67.75 in | Wt 222.2 lb

## 2017-09-12 DIAGNOSIS — Z1211 Encounter for screening for malignant neoplasm of colon: Secondary | ICD-10-CM

## 2017-09-12 DIAGNOSIS — Z0000 Encounter for general adult medical examination without abnormal findings: Secondary | ICD-10-CM

## 2017-09-12 DIAGNOSIS — E669 Obesity, unspecified: Secondary | ICD-10-CM | POA: Diagnosis not present

## 2017-09-12 DIAGNOSIS — N529 Male erectile dysfunction, unspecified: Secondary | ICD-10-CM

## 2017-09-12 DIAGNOSIS — R7303 Prediabetes: Secondary | ICD-10-CM | POA: Diagnosis not present

## 2017-09-12 DIAGNOSIS — Z23 Encounter for immunization: Secondary | ICD-10-CM | POA: Diagnosis not present

## 2017-09-12 DIAGNOSIS — E785 Hyperlipidemia, unspecified: Secondary | ICD-10-CM

## 2017-09-12 DIAGNOSIS — R030 Elevated blood-pressure reading, without diagnosis of hypertension: Secondary | ICD-10-CM

## 2017-09-12 MED ORDER — SERTRALINE HCL 50 MG PO TABS: ORAL_TABLET | ORAL | 3 refills | Status: DC

## 2017-09-12 MED ORDER — SILDENAFIL CITRATE 20 MG PO TABS: 60.0000 mg | ORAL_TABLET | Freq: Every day | ORAL | 11 refills | Status: DC | PRN

## 2017-09-12 NOTE — Progress Notes (Signed)
BP (!) 150/100 (BP Location: Right Arm, Cuff Size: Large)   Pulse 66   Temp 98 F (36.7 C) (Oral)   Ht 5' 7.75" (1.721 m)   Wt 222 lb 4 oz (100.8 kg)   SpO2 98%   BMI 34.04 kg/m    CC: CPE Subjective:    Patient ID: Samuel Harrington, male    DOB: June 06, 1960, 57 y.o.   MRN: [id number]  HPI: Samuel Harrington is a 57 y.o. male presenting on 09/12/2017 for Annual Exam (Pt explains BP is probably elevated due to some recent family issues.)   Increased family stress over the weekend He started low carb diet - and lost 7 lbs over the last few eweks.   Preventative: Colon cancer screening - we have provided pt with 6 different stool kits, he has never completed. Agrees to repeat today.  Prostate cancer screening - no nocturia. No fmhx. PSA reassuring - will defer DRE Flu shot declines - felt ill last year. Td 2007 Non smoker  Alcohol - occasional Dentist Q6 mo Eye exam yearly  Lives with wife and daughter, 1 cat, outside dogs Occupation: promoted to Actuary at [employer]  Activity: no regular exercise - some hiking  Diet: good water, fruits/vegetables some   Relevant past medical, surgical, family and social history reviewed and updated as indicated. Interim medical history since our last visit reviewed. Allergies and medications reviewed and updated. Outpatient Medications Prior to Visit  Medication Sig Dispense Refill  . sertraline (ZOLOFT) 50 MG tablet TAKE 1.5 TABLETS  DAILY AT BEDTIME 135 tablet 3  . sildenafil (VIAGRA) 100 MG tablet Take 1 tablet (100 mg total) by mouth daily as needed. 6 tablet 11  . promethazine-dextromethorphan (PROMETHAZINE-DM) 6.25-15 MG/5ML syrup Take 5 mLs by mouth 4 (four) times daily as needed for cough. With caution of sedation 118 mL 0   No facility-administered medications prior to visit.      Per HPI unless specifically indicated in ROS section below Review of Systems  Constitutional: Negative for activity change, appetite change,  chills, fatigue, fever and unexpected weight change.  HENT: Positive for congestion. Negative for hearing loss.   Eyes: Negative for visual disturbance.  Respiratory: Positive for cough (URI last week). Negative for chest tightness, shortness of breath and wheezing.   Cardiovascular: Negative for chest pain, palpitations and leg swelling.  Gastrointestinal: Negative for abdominal distention, abdominal pain, blood in stool, constipation, diarrhea, nausea and vomiting.  Genitourinary: Negative for difficulty urinating and hematuria.  Musculoskeletal: Negative for arthralgias, myalgias and neck pain.  Skin: Negative for rash.  Neurological: Negative for dizziness, seizures, syncope and headaches.  Hematological: Negative for adenopathy. Does not bruise/bleed easily.  Psychiatric/Behavioral: Negative for dysphoric mood. The patient is not nervous/anxious.       Objective:    BP (!) 150/100 (BP Location: Right Arm, Cuff Size: Large)   Pulse 66   Temp 98 F (36.7 C) (Oral)   Ht 5' 7.75" (1.721 m)   Wt 222 lb 4 oz (100.8 kg)   SpO2 98%   BMI 34.04 kg/m   Wt Readings from Last 3 Encounters:  09/12/17 222 lb 4 oz (100.8 kg)  08/29/17 229 lb 8 oz (104.1 kg)  02/28/17 222 lb 8 oz (100.9 kg)    Physical Exam  Constitutional: He is oriented to person, place, and time. He appears well-developed and well-nourished. No distress.  HENT:  Head: Normocephalic and atraumatic.  Right Ear: Hearing, tympanic membrane, external ear and  ear canal normal.  Left Ear: Hearing, tympanic membrane, external ear and ear canal normal.  Nose: Nose normal.  Mouth/Throat: Uvula is midline, oropharynx is clear and moist and mucous membranes are normal. No oropharyngeal exudate, posterior oropharyngeal edema or posterior oropharyngeal erythema.  Eyes: Pupils are equal, round, and reactive to light. Conjunctivae and EOM are normal. No scleral icterus.  Neck: Normal range of motion. Neck supple. No thyromegaly  present.  Cardiovascular: Normal rate, regular rhythm, normal heart sounds and intact distal pulses.  No murmur heard. Pulses:      Radial pulses are 2+ on the right side, and 2+ on the left side.  Pulmonary/Chest: Effort normal and breath sounds normal. No respiratory distress. He has no wheezes. He has no rales.  Abdominal: Soft. Bowel sounds are normal. He exhibits no distension and no mass. There is no tenderness. There is no rebound and no guarding.  Musculoskeletal: Normal range of motion. He exhibits no edema.  Lymphadenopathy:    He has no cervical adenopathy.  Neurological: He is alert and oriented to person, place, and time.  CN grossly intact, station and gait intact  Skin: Skin is warm and dry. No rash noted.  Psychiatric: He has a normal mood and affect. His behavior is normal. Judgment and thought content normal.  Nursing note and vitals reviewed.  Results for orders placed or performed in visit on 08/29/17  Hepatitis C antibody  Result Value Ref Range   Hepatitis C Ab NON-REACTIVE NON-REACTI   SIGNAL TO CUT-OFF 0.01 <1.00  Hemoglobin A1c  Result Value Ref Range   Hgb A1c MFr Bld 6.2 4.6 - 6.5 %  Basic metabolic panel  Result Value Ref Range   Sodium 141 135 - 145 mEq/L   Potassium 3.8 3.5 - 5.1 mEq/L   Chloride 107 96 - 112 mEq/L   CO2 25 19 - 32 mEq/L   Glucose, Bld 117 (H) 70 - 99 mg/dL   BUN 13 6 - 23 mg/dL   Creatinine, Ser 1.61 0.40 - 1.50 mg/dL   Calcium 8.9 8.4 - 09.6 mg/dL   GFR 04.54 >09.81 mL/min  PSA  Result Value Ref Range   PSA 0.52 0.10 - 4.00 ng/mL  Lipid panel  Result Value Ref Range   Cholesterol 195 0 - 200 mg/dL   Triglycerides 191.4 (H) 0.0 - 149.0 mg/dL   HDL 78.29 (L) >56.21 mg/dL   VLDL 30.8 (H) 0.0 - 65.7 mg/dL   Total CHOL/HDL Ratio 7    NonHDL 165.52   LDL cholesterol, direct  Result Value Ref Range   Direct LDL 136.0 mg/dL      Assessment & Plan:  Promises to turn in iFOB for colon cancer screening.  Problem List Items  Addressed This Visit    Prediabetes    Discussed with patient, aware to avoid added sugars.       Obesity, Class I, BMI 30-34.9    Encouraged healthy diet and lifestyle choices to affect sustainable weight loss. He has already implemented low carb diet and back off sodas with good results - feels this is sustainable.       Healthcare maintenance - Primary    Preventative protocols reviewed and updated unless pt declined. Discussed healthy diet and lifestyle.       ERECTILE DYSFUNCTION, ORGANIC    Chronic, stable. Generic sildenafil 20mg  sent to pharmacy to price out - pt aware this will be out of pocket.      Elevated blood pressure reading  Elevated reading noted today - he will start monitoring BP regularly at home and bring me log to review. If staying consistently >140/90 over next several weeks, advised he come in for office visit to discuss starting medication.      Dyslipidemia    Chronic, not on statin. Encouraged increased fatty fish and limiting added sugars to improve triglyceride levels.  The 10-year ASCVD risk score Denman George DC Montez Hageman., et al., 2013) is: 12.2%   Values used to calculate the score:     Age: 28 years     Sex: Male     Is Non-Hispanic African American: No     Diabetic: No     Tobacco smoker: No     Systolic Blood Pressure: 150 mmHg     Is BP treated: No     HDL Cholesterol: 29.2 mg/dL     Total Cholesterol: 195 mg/dL        Other Visit Diagnoses    Special screening for malignant neoplasms, colon       Relevant Orders   Fecal occult blood, imunochemical   Need for Tdap vaccination       Relevant Orders   Tdap vaccine greater than or equal to 7yo IM (Completed)       Meds ordered this encounter  Medications  . sertraline (ZOLOFT) 50 MG tablet    Sig: TAKE 1.5 TABLETS  DAILY AT BEDTIME    Dispense:  135 tablet    Refill:  3  . sildenafil (REVATIO) 20 MG tablet    Sig: Take 3-5 tablets (60-100 mg total) by mouth daily as needed (relations).     Dispense:  30 tablet    Refill:  11   Orders Placed This Encounter  Procedures  . Fecal occult blood, imunochemical    Standing Status:   Future    Standing Expiration Date:   09/13/2018  . Tdap vaccine greater than or equal to 7yo IM    Follow up plan: Return in about 1 year (around 09/13/2018) for annual exam, prior fasting for blood work.  Eustaquio Boyden, MD

## 2017-09-12 NOTE — Assessment & Plan Note (Signed)
Elevated reading noted today - he will start monitoring BP regularly at home and bring me log to review. If staying consistently >140/90 over next several weeks, advised he come in for office visit to discuss starting medication.

## 2017-09-12 NOTE — Assessment & Plan Note (Addendum)
Chronic, not on statin. Encouraged increased fatty fish and limiting added sugars to improve triglyceride levels.  The 10-year ASCVD risk score Denman George DC Montez Hageman., et al., 2013) is: 12.2%   Values used to calculate the score:     Age: 57 years     Sex: Male     Is Non-Hispanic African American: No     Diabetic: No     Tobacco smoker: No     Systolic Blood Pressure: 150 mmHg     Is BP treated: No     HDL Cholesterol: 29.2 mg/dL     Total Cholesterol: 195 mg/dL

## 2017-09-12 NOTE — Assessment & Plan Note (Signed)
Encouraged healthy diet and lifestyle choices to affect sustainable weight loss. He has already implemented low carb diet and back off sodas with good results - feels this is sustainable.

## 2017-09-12 NOTE — Patient Instructions (Addendum)
Tdap today.  Start watching blood pressures at home - goal <140/90. If consistently elevated, return sooner for follow up (1-2 months). If you can, bring in bp log to review (you can drop this off up front) Fill out stool card for colon cancer screening.  Work on fatty fish in diet, continue low carb low sugar diet.  Generic sildenafil sent to pharmacy to price out.  Return as needed or in 1 year for next physical.   Health Maintenance, Male A healthy lifestyle and preventive care is important for your health and wellness. Ask your health care provider about what schedule of regular examinations is right for you. What should I know about weight and diet? Eat a Healthy Diet  Eat plenty of vegetables, fruits, whole grains, low-fat dairy products, and lean protein.  Do not eat a lot of foods high in solid fats, added sugars, or salt.  Maintain a Healthy Weight Regular exercise can help you achieve or maintain a healthy weight. You should:  Do at least 150 minutes of exercise each week. The exercise should increase your heart rate and make you sweat (moderate-intensity exercise).  Do strength-training exercises at least twice a week.  Watch Your Levels of Cholesterol and Blood Lipids  Have your blood tested for lipids and cholesterol every 5 years starting at 56 years of age. If you are at high risk for heart disease, you should start having your blood tested when you are 56 years old. You may need to have your cholesterol levels checked more often if: ? Your lipid or cholesterol levels are high. ? You are older than 56 years of age. ? You are at high risk for heart disease.  What should I know about cancer screening? Many types of cancers can be detected early and may often be prevented. Lung Cancer  You should be screened every year for lung cancer if: ? You are a current smoker who has smoked for at least 30 years. ? You are a former smoker who has quit within the past 15  years.  Talk to your health care provider about your screening options, when you should start screening, and how often you should be screened.  Colorectal Cancer  Routine colorectal cancer screening usually begins at 56 years of age and should be repeated every 5-10 years until you are 56 years old. You may need to be screened more often if early forms of precancerous polyps or small growths are found. Your health care provider may recommend screening at an earlier age if you have risk factors for colon cancer.  Your health care provider may recommend using home test kits to check for hidden blood in the stool.  A small camera at the end of a tube can be used to examine your colon (sigmoidoscopy or colonoscopy). This checks for the earliest forms of colorectal cancer.  Prostate and Testicular Cancer  Depending on your age and overall health, your health care provider may do certain tests to screen for prostate and testicular cancer.  Talk to your health care provider about any symptoms or concerns you have about testicular or prostate cancer.  Skin Cancer  Check your skin from head to toe regularly.  Tell your health care provider about any new moles or changes in moles, especially if: ? There is a change in a mole's size, shape, or color. ? You have a mole that is larger than a pencil eraser.  Always use sunscreen. Apply sunscreen liberally and repeat throughout  the day.  Protect yourself by wearing long sleeves, pants, a wide-brimmed hat, and sunglasses when outside.  What should I know about heart disease, diabetes, and high blood pressure?  If you are 71-59 years of age, have your blood pressure checked every 3-5 years. If you are 34 years of age or older, have your blood pressure checked every year. You should have your blood pressure measured twice-once when you are at a hospital or clinic, and once when you are not at a hospital or clinic. Record the average of the two  measurements. To check your blood pressure when you are not at a hospital or clinic, you can use: ? An automated blood pressure machine at a pharmacy. ? A home blood pressure monitor.  Talk to your health care provider about your target blood pressure.  If you are between 39-39 years old, ask your health care provider if you should take aspirin to prevent heart disease.  Have regular diabetes screenings by checking your fasting blood sugar level. ? If you are at a normal weight and have a low risk for diabetes, have this test once every three years after the age of 12. ? If you are overweight and have a high risk for diabetes, consider being tested at a younger age or more often.  A one-time screening for abdominal aortic aneurysm (AAA) by ultrasound is recommended for men aged 92-75 years who are current or former smokers. What should I know about preventing infection? Hepatitis B If you have a higher risk for hepatitis B, you should be screened for this virus. Talk with your health care provider to find out if you are at risk for hepatitis B infection. Hepatitis C Blood testing is recommended for:  Everyone born from [birth year] through 1965.  Anyone with known risk factors for hepatitis C.  Sexually Transmitted Diseases (STDs)  You should be screened each year for STDs including gonorrhea and chlamydia if: ? You are sexually active and are younger than 56 years of age. ? You are older than 56 years of age and your health care provider tells you that you are at risk for this type of infection. ? Your sexual activity has changed since you were last screened and you are at an increased risk for chlamydia or gonorrhea. Ask your health care provider if you are at risk.  Talk with your health care provider about whether you are at high risk of being infected with HIV. Your health care provider may recommend a prescription medicine to help prevent HIV infection.  What else can I do?  Schedule  regular health, dental, and eye exams.  Stay current with your vaccines (immunizations).  Do not use any tobacco products, such as cigarettes, chewing tobacco, and e-cigarettes. If you need help quitting, ask your health care provider.  Limit alcohol intake to no more than 2 drinks per day. One drink equals 12 ounces of beer, 5 ounces of wine, or 1 ounces of hard liquor.  Do not use street drugs.  Do not share needles.  Ask your health care provider for help if you need support or information about quitting drugs.  Tell your health care provider if you often feel depressed.  Tell your health care provider if you have ever been abused or do not feel safe at home. This information is not intended to replace advice given to you by your health care provider. Make sure you discuss any questions you have with your health care provider. Document  Released: 06/18/2007 Document Revised: 08/19/2015 Document Reviewed: 09/23/2014 Elsevier Interactive Patient Education  [employer].

## 2017-09-12 NOTE — Assessment & Plan Note (Signed)
Preventative protocols reviewed and updated unless pt declined. Discussed healthy diet and lifestyle.  

## 2017-09-12 NOTE — Assessment & Plan Note (Signed)
Chronic, stable. Generic sildenafil 20mg  sent to pharmacy to price out - pt aware this will be out of pocket.

## 2017-09-12 NOTE — Assessment & Plan Note (Signed)
Discussed with patient, aware to avoid added sugars.

## 2018-09-13 ENCOUNTER — Other Ambulatory Visit: Payer: Self-pay | Admitting: Family Medicine

## 2018-09-13 NOTE — Telephone Encounter (Signed)
E-scribed refill.  Pls schedule annual CPE and lab visit.

## 2018-09-14 NOTE — Telephone Encounter (Signed)
Pt is scheduled for 11/16/18 pt prefers same day labs

## 2018-11-16 ENCOUNTER — Encounter: Payer: Self-pay | Admitting: Family Medicine

## 2018-11-16 ENCOUNTER — Ambulatory Visit (INDEPENDENT_AMBULATORY_CARE_PROVIDER_SITE_OTHER): Payer: BC Managed Care – PPO | Admitting: Family Medicine

## 2018-11-16 ENCOUNTER — Other Ambulatory Visit: Payer: Self-pay

## 2018-11-16 VITALS — BP 140/78 | HR 71 | Temp 97.9°F | Ht 67.5 in | Wt 204.1 lb

## 2018-11-16 DIAGNOSIS — E785 Hyperlipidemia, unspecified: Secondary | ICD-10-CM | POA: Diagnosis not present

## 2018-11-16 DIAGNOSIS — R7303 Prediabetes: Secondary | ICD-10-CM

## 2018-11-16 DIAGNOSIS — Z125 Encounter for screening for malignant neoplasm of prostate: Secondary | ICD-10-CM

## 2018-11-16 DIAGNOSIS — N529 Male erectile dysfunction, unspecified: Secondary | ICD-10-CM

## 2018-11-16 DIAGNOSIS — Z1211 Encounter for screening for malignant neoplasm of colon: Secondary | ICD-10-CM | POA: Diagnosis not present

## 2018-11-16 DIAGNOSIS — E669 Obesity, unspecified: Secondary | ICD-10-CM

## 2018-11-16 DIAGNOSIS — Z0000 Encounter for general adult medical examination without abnormal findings: Secondary | ICD-10-CM

## 2018-11-16 DIAGNOSIS — R454 Irritability and anger: Secondary | ICD-10-CM

## 2018-11-16 DIAGNOSIS — R7989 Other specified abnormal findings of blood chemistry: Secondary | ICD-10-CM

## 2018-11-16 LAB — COMPREHENSIVE METABOLIC PANEL
ALT: 22 U/L (ref 0–53)
AST: 17 U/L (ref 0–37)
Albumin: 4.6 g/dL (ref 3.5–5.2)
Alkaline Phosphatase: 69 U/L (ref 39–117)
BUN: 15 mg/dL (ref 6–23)
CO2: 25 mEq/L (ref 19–32)
Calcium: 9.6 mg/dL (ref 8.4–10.5)
Chloride: 104 mEq/L (ref 96–112)
Creatinine, Ser: 0.96 mg/dL (ref 0.40–1.50)
GFR: 80.6 mL/min (ref >60.00)
Glucose, Bld: 99 mg/dL (ref 70–99)
Potassium: 4.9 mEq/L (ref 3.5–5.1)
Sodium: 138 mEq/L (ref 135–145)
Total Bilirubin: 0.7 mg/dL (ref 0.2–1.2)
Total Protein: 7.1 g/dL (ref 6.0–8.3)

## 2018-11-16 LAB — CBC WITH DIFFERENTIAL/PLATELET
Basophils Absolute: 0 10*3/uL (ref 0.0–0.1)
Basophils Relative: 0.5 % (ref 0.0–3.0)
Eosinophils Absolute: 0.1 10*3/uL (ref 0.0–0.7)
Eosinophils Relative: 1.1 % (ref 0.0–5.0)
HCT: 43 % (ref 39.0–52.0)
Hemoglobin: 14.7 g/dL (ref 13.0–17.0)
Lymphocytes Relative: 25.5 % (ref 12.0–46.0)
Lymphs Abs: 1.7 10*3/uL (ref 0.7–4.0)
MCHC: 34.1 g/dL (ref 30.0–36.0)
MCV: 92.4 fl (ref 78.0–100.0)
Monocytes Absolute: 0.6 10*3/uL (ref 0.1–1.0)
Monocytes Relative: 8.7 % (ref 3.0–12.0)
Neutro Abs: 4.4 10*3/uL (ref 1.4–7.7)
Neutrophils Relative %: 64.2 % (ref 43.0–77.0)
Platelets: 261 10*3/uL (ref 150.0–400.0)
RBC: 4.66 Mil/uL (ref 4.22–5.81)
RDW: 12.8 % (ref 11.5–15.5)
WBC: 6.8 10*3/uL (ref 4.0–10.5)

## 2018-11-16 LAB — LIPID PANEL
Cholesterol: 194 mg/dL (ref 0–200)
HDL: 30.8 mg/dL — ABNORMAL LOW (ref >39.00)
LDL Cholesterol: 132 mg/dL — ABNORMAL HIGH (ref 0–99)
NonHDL: 163.17
Total CHOL/HDL Ratio: 6
Triglycerides: 155 mg/dL — ABNORMAL HIGH (ref 0.0–149.0)
VLDL: 31 mg/dL (ref 0.0–40.0)

## 2018-11-16 LAB — HEMOGLOBIN A1C: Hgb A1c MFr Bld: 5.4 % (ref 4.6–6.5)

## 2018-11-16 LAB — PSA: PSA: 0.51 ng/mL (ref 0.10–4.00)

## 2018-11-16 LAB — TSH: TSH: 1.7 u[IU]/mL (ref 0.35–4.50)

## 2018-11-16 MED ORDER — SERTRALINE HCL 50 MG PO TABS: ORAL_TABLET | ORAL | 3 refills | Status: DC

## 2018-11-16 MED ORDER — SILDENAFIL CITRATE 20 MG PO TABS: 60.0000 mg | ORAL_TABLET | Freq: Every day | ORAL | 11 refills | Status: DC | PRN

## 2018-11-16 NOTE — Assessment & Plan Note (Signed)
Update A1c ?

## 2018-11-16 NOTE — Assessment & Plan Note (Addendum)
Sildenafil refilled - doing well with generic.

## 2018-11-16 NOTE — Assessment & Plan Note (Signed)
Chronic, update FLP off statin. The 10-year ASCVD risk score Mikey Bussing DC Brooke Bonito., et al., 2013) is: 11.6%   Values used to calculate the score:     Age: 58 years     Sex: Male     Is Non-Hispanic African American: No     Diabetic: No     Tobacco smoker: No     Systolic Blood Pressure: 790 mmHg     Is BP treated: No     HDL Cholesterol: 29.2 mg/dL     Total Cholesterol: 195 mg/dL

## 2018-11-16 NOTE — Assessment & Plan Note (Signed)
Stable period on sertraline - desires to continue. 

## 2018-11-16 NOTE — Patient Instructions (Addendum)
Labs today Congratulations on weight loss to date! Continue regular walking. Return as needed or in 1 year for next physical.  Health Maintenance, Male Adopting a healthy lifestyle and getting preventive care are important in promoting health and wellness. Ask your health care provider about:  The right schedule for you to have regular tests and exams.  Things you can do on your own to prevent diseases and keep yourself healthy. What should I know about diet, weight, and exercise? Eat a healthy diet   Eat a diet that includes plenty of vegetables, fruits, low-fat dairy products, and lean protein.  Do not eat a lot of foods that are high in solid fats, added sugars, or sodium. Maintain a healthy weight Body mass index (BMI) is a measurement that can be used to identify possible weight problems. It estimates body fat based on height and weight. Your health care provider can help determine your BMI and help you achieve or maintain a healthy weight. Get regular exercise Get regular exercise. This is one of the most important things you can do for your health. Most adults should:  Exercise for at least 150 minutes each week. The exercise should increase your heart rate and make you sweat (moderate-intensity exercise).  Do strengthening exercises at least twice a week. This is in addition to the moderate-intensity exercise.  Spend less time sitting. Even light physical activity can be beneficial. Watch cholesterol and blood lipids Have your blood tested for lipids and cholesterol at 57 years of age, then have this test every 5 years. You may need to have your cholesterol levels checked more often if:  Your lipid or cholesterol levels are high.  You are older than 58 years of age.  You are at high risk for heart disease. What should I know about cancer screening? Many types of cancers can be detected early and may often be prevented. Depending on your health history and family history,  you may need to have cancer screening at various ages. This may include screening for:  Colorectal cancer.  Prostate cancer.  Skin cancer.  Lung cancer. What should I know about heart disease, diabetes, and high blood pressure? Blood pressure and heart disease  High blood pressure causes heart disease and increases the risk of stroke. This is more likely to develop in people who have high blood pressure readings, are of African descent, or are overweight.  Talk with your health care provider about your target blood pressure readings.  Have your blood pressure checked: ? Every 3-5 years if you are 31-35 years of age. ? Every year if you are 20 years old or older.  If you are between the ages of 57 and 41 and are a current or former smoker, ask your health care provider if you should have a one-time screening for abdominal aortic aneurysm (AAA). Diabetes Have regular diabetes screenings. This checks your fasting blood sugar level. Have the screening done:  Once every three years after age 71 if you are at a normal weight and have a low risk for diabetes.  More often and at a younger age if you are overweight or have a high risk for diabetes. What should I know about preventing infection? Hepatitis B If you have a higher risk for hepatitis B, you should be screened for this virus. Talk with your health care provider to find out if you are at risk for hepatitis B infection. Hepatitis C Blood testing is recommended for:  Everyone born from [birth year]  through 1965.  Anyone with known risk factors for hepatitis C. Sexually transmitted infections (STIs)  You should be screened each year for STIs, including gonorrhea and chlamydia, if: ? You are sexually active and are younger than 57 years of age. ? You are older than 57 years of age and your health care provider tells you that you are at risk for this type of infection. ? Your sexual activity has changed since you were last screened, and  you are at increased risk for chlamydia or gonorrhea. Ask your health care provider if you are at risk.  Ask your health care provider about whether you are at high risk for HIV. Your health care provider may recommend a prescription medicine to help prevent HIV infection. If you choose to take medicine to prevent HIV, you should first get tested for HIV. You should then be tested every 3 months for as long as you are taking the medicine. Follow these instructions at home: Lifestyle  Do not use any products that contain nicotine or tobacco, such as cigarettes, e-cigarettes, and chewing tobacco. If you need help quitting, ask your health care provider.  Do not use street drugs.  Do not share needles.  Ask your health care provider for help if you need support or information about quitting drugs. Alcohol use  Do not drink alcohol if your health care provider tells you not to drink.  If you drink alcohol: ? Limit how much you have to 0-2 drinks a day. ? Be aware of how much alcohol is in your drink. In the [place] one drink equals one 12 oz bottle of beer (355 mL), one 5 oz glass of wine (148 mL), or one 1 oz glass of hard liquor (44 mL). General instructions  Schedule regular health, dental, and eye exams.  Stay current with your vaccines.  Tell your health care provider if: ? You often feel depressed. ? You have ever been abused or do not feel safe at home. Summary  Adopting a healthy lifestyle and getting preventive care are important in promoting health and wellness.  Follow your health care provider's instructions about healthy diet, exercising, and getting tested or screened for diseases.  Follow your health care provider's instructions on monitoring your cholesterol and blood pressure. This information is not intended to replace advice given to you by your health care provider. Make sure you discuss any questions you have with your health care provider. Document Released:  06/18/2007 Document Revised: 12/13/2017 Document Reviewed: 12/13/2017 Elsevier Patient Education  2020 [employer].

## 2018-11-16 NOTE — Assessment & Plan Note (Signed)
Preventative protocols reviewed and updated unless pt declined. Discussed healthy diet and lifestyle.  

## 2018-11-16 NOTE — Assessment & Plan Note (Signed)
Congratulated on weight loss to date, pt motivated to continue healthy diet/lifestyle changes.

## 2018-11-16 NOTE — Progress Notes (Signed)
This visit was conducted in person.  BP 140/78 (BP Location: Left Arm, Patient Position: Sitting, Cuff Size: Normal)   Pulse 71   Temp 97.9 F (36.6 C) (Temporal)   Ht 5' 7.5" (1.715 m)   Wt 204 lb 1 oz (92.6 kg)   SpO2 98%   BMI 31.49 kg/m    CC: CPE Subjective:    Patient ID: Samuel Harrington, male    DOB: September 21, 1960, 58 y.o.   MRN: [id number]  HPI: Samuel Harrington is a 58 y.o. male presenting on 11/16/2018 for Annual Exam   His mother passed away 3 mo ago. She was not doing well.  25 lb weight loss since last year - eating healthier, walking regularly. Stopped bread.   Preventative: Colon cancer screening - we have provided pt with 7 different stool kits, he has never completed. Previously agreed to repeat.  Prostate cancer screening - no nocturia. No fmhx. PSA reassuring - will defer DRE  Flu shot - declines Tdap 2019 Non smoker, wife smokes outside Alcohol -occasional Dentist Q6 mo - recent dental work.  Eye exam yearly  Lives with wife and daughter, 1 cat, outside dogs Occupation: promoted to Actuarydistric manager at [employer]  Activity: walking at home and at work Diet: good water, fruits/vegetablessome      Relevant past medical, surgical, family and social history reviewed and updated as indicated. Interim medical history since our last visit reviewed. Allergies and medications reviewed and updated. Outpatient Medications Prior to Visit  Medication Sig Dispense Refill  . sertraline (ZOLOFT) 50 MG tablet TAKE 1.5 TABLETS BY MOUTH DAILY AT BEDTIME 135 tablet 0  . sildenafil (REVATIO) 20 MG tablet Take 3-5 tablets (60-100 mg total) by mouth daily as needed (relations). 30 tablet 11   No facility-administered medications prior to visit.      Per HPI unless specifically indicated in ROS section below Review of Systems  Constitutional: Negative for activity change, appetite change, chills, fatigue, fever and unexpected weight change.  HENT: Negative for hearing  loss.   Eyes: Negative for visual disturbance.  Respiratory: Negative for cough, chest tightness, shortness of breath and wheezing.   Cardiovascular: Negative for chest pain, palpitations and leg swelling.  Gastrointestinal: Negative for abdominal distention, abdominal pain, blood in stool, constipation, diarrhea, nausea and vomiting.  Genitourinary: Negative for difficulty urinating and hematuria.  Musculoskeletal: Negative for arthralgias, myalgias and neck pain.  Skin: Negative for rash.  Neurological: Positive for dizziness. Negative for seizures, syncope and headaches.  Hematological: Negative for adenopathy. Does not bruise/bleed easily.  Psychiatric/Behavioral: Negative for dysphoric mood. The patient is not nervous/anxious.    Objective:    BP 140/78 (BP Location: Left Arm, Patient Position: Sitting, Cuff Size: Normal)   Pulse 71   Temp 97.9 F (36.6 C) (Temporal)   Ht 5' 7.5" (1.715 m)   Wt 204 lb 1 oz (92.6 kg)   SpO2 98%   BMI 31.49 kg/m   Wt Readings from Last 3 Encounters:  11/16/18 204 lb 1 oz (92.6 kg)  09/12/17 222 lb 4 oz (100.8 kg)  08/29/17 229 lb 8 oz (104.1 kg)    Physical Exam Vitals signs and nursing note reviewed.  Constitutional:      General: He is not in acute distress.    Appearance: He is well-developed.  HENT:     Head: Normocephalic and atraumatic.     Right Ear: Hearing, tympanic membrane, ear canal and external ear normal.     Left Ear:  Hearing, tympanic membrane, ear canal and external ear normal.     Nose: Nose normal.     Mouth/Throat:     Pharynx: Uvula midline. No oropharyngeal exudate or posterior oropharyngeal erythema.  Eyes:     General: No scleral icterus.    Conjunctiva/sclera: Conjunctivae normal.     Pupils: Pupils are equal, round, and reactive to light.  Neck:     Musculoskeletal: Normal range of motion and neck supple.  Cardiovascular:     Rate and Rhythm: Normal rate and regular rhythm.     Pulses:          Radial  pulses are 2+ on the right side and 2+ on the left side.     Heart sounds: Normal heart sounds. No murmur.  Pulmonary:     Effort: Pulmonary effort is normal. No respiratory distress.     Breath sounds: Normal breath sounds. No wheezing or rales.  Abdominal:     General: Bowel sounds are normal. There is no distension.     Palpations: Abdomen is soft. There is no mass.     Tenderness: There is no abdominal tenderness. There is no guarding or rebound.  Musculoskeletal: Normal range of motion.  Lymphadenopathy:     Cervical: No cervical adenopathy.  Skin:    General: Skin is warm and dry.     Findings: No rash.  Neurological:     Mental Status: He is alert and oriented to person, place, and time.     Comments: CN grossly intact, station and gait intact  Psychiatric:        Behavior: Behavior normal.        Thought Content: Thought content normal.        Judgment: Judgment normal.       Results for orders placed or performed in visit on 08/29/17  Hepatitis C antibody  Result Value Ref Range   Hepatitis C Ab NON-REACTIVE NON-REACTI   SIGNAL TO CUT-OFF 0.01 <1.00  Hemoglobin A1c  Result Value Ref Range   Hgb A1c MFr Bld 6.2 4.6 - 6.5 %  Basic metabolic panel  Result Value Ref Range   Sodium 141 135 - 145 mEq/L   Potassium 3.8 3.5 - 5.1 mEq/L   Chloride 107 96 - 112 mEq/L   CO2 25 19 - 32 mEq/L   Glucose, Bld 117 (H) 70 - 99 mg/dL   BUN 13 6 - 23 mg/dL   Creatinine, Ser 0.98 0.40 - 1.50 mg/dL   Calcium 8.9 8.4 - 10.5 mg/dL   GFR 84.01 >60.00 mL/min  PSA  Result Value Ref Range   PSA 0.52 0.10 - 4.00 ng/mL  Lipid panel  Result Value Ref Range   Cholesterol 195 0 - 200 mg/dL   Triglycerides 289.0 (H) 0.0 - 149.0 mg/dL   HDL 29.20 (L) >39.00 mg/dL   VLDL 57.8 (H) 0.0 - 40.0 mg/dL   Total CHOL/HDL Ratio 7    NonHDL 165.52   LDL cholesterol, direct  Result Value Ref Range   Direct LDL 136.0 mg/dL   Assessment & Plan:   Problem List Items Addressed This Visit     Prediabetes    Update A1c.       Relevant Orders   Hemoglobin A1c   Obesity, Class I, BMI 30-34.9    Congratulated on weight loss to date, pt motivated to continue healthy diet/lifestyle changes.       Irritability and anger    Stable period on sertraline - desires to  continue.       Healthcare maintenance - Primary    Preventative protocols reviewed and updated unless pt declined. Discussed healthy diet and lifestyle.       ERECTILE DYSFUNCTION, ORGANIC    Sildenafil refilled - doing well with generic.       Dyslipidemia    Chronic, update FLP off statin. The 10-year ASCVD risk score Denman George DC Montez Hageman., et al., 2013) is: 11.6%   Values used to calculate the score:     Age: 79 years     Sex: Male     Is Non-Hispanic African American: No     Diabetic: No     Tobacco smoker: No     Systolic Blood Pressure: 140 mmHg     Is BP treated: No     HDL Cholesterol: 29.2 mg/dL     Total Cholesterol: 195 mg/dL       Relevant Orders   Lipid panel   Comprehensive metabolic panel    Other Visit Diagnoses    Special screening for malignant neoplasms, colon       Relevant Orders   Fecal occult blood, imunochemical   Abnormal TSH       Relevant Orders   TSH   CBC with Differential   Special screening for malignant neoplasm of prostate       Relevant Orders   PSA       Meds ordered this encounter  Medications  . sertraline (ZOLOFT) 50 MG tablet    Sig: TAKE 1.5 TABLETS BY MOUTH DAILY AT BEDTIME    Dispense:  135 tablet    Refill:  3  . sildenafil (REVATIO) 20 MG tablet    Sig: Take 3-5 tablets (60-100 mg total) by mouth daily as needed (relations).    Dispense:  30 tablet    Refill:  11   Orders Placed This Encounter  Procedures  . Fecal occult blood, imunochemical    Standing Status:   Future    Standing Expiration Date:   11/16/2019  . Lipid panel  . Comprehensive metabolic panel  . TSH  . PSA  . CBC with Differential  . Hemoglobin A1c    Follow up plan:  Return in about 1 year (around 11/16/2019) for annual exam, prior fasting for blood work.  Eustaquio Boyden, MD

## 2019-09-30 ENCOUNTER — Telehealth: Payer: Self-pay

## 2019-09-30 MED ORDER — SERTRALINE HCL 50 MG PO TABS: ORAL_TABLET | ORAL | 0 refills | Status: DC

## 2019-09-30 NOTE — Telephone Encounter (Signed)
E-scribed 30-day refill to [id number], [place], [place] per pt request.   Lvm asking pt to call back.  Need to inform pt of above info.

## 2019-09-30 NOTE — Telephone Encounter (Signed)
LVM on cell number. Home number says it is no longer in service.

## 2019-09-30 NOTE — Telephone Encounter (Signed)
[facility] Night - Client TELEPHONE ADVICE RECORD [employer]urse Patient Name: Samuel Harrington Gender: Male DOB: March 23, 1960 Age: 59 Y 4 M 2 D Return Phone Number: [phone] (Primary), [phone] (Secondary) Address: City/State/Zip: [place] [address] Client [facility] Night - Client Client Site [facility] - Night Physician Eustaquio Boyden - MD Contact Type Call Who Is Calling Patient / Member / Family / Caregiver Call Type Triage / Clinical Relationship To Patient Self Return Phone Number [phone] (Secondary) Chief Complaint Prescription Refill or Medication Request (non symptomatic) Reason for Call Symptomatic / Request for Health Information Initial Comment Caller states he is needing a medication called in. - Sertraline Translation No Nurse Assessment Nurse: Reinaldo Berber, RN, Adriana Date/Time (Eastern Time): 09/28/2019 9:34:01 AM Confirm and document reason for call. If symptomatic, describe symptoms. ---Caller reports he is on Vacation in [place] and forgot his medication. Requesting medication, sertraline medication. Last dose taken on Thursday. Does the patient have any new or worsening symptoms? ---Yes Will a triage be completed? ---Yes Related visit to physician within the last 2 weeks? ---No Does the PT have any chronic conditions? (i.e. diabetes, asthma, this includes High risk factors for pregnancy, etc.) ---Yes List chronic conditions. ---depression/anxxiety Is this a behavioral health or substance abuse call? ---No Nurse: Reinaldo Berber, RN, Adriana Date/Time (Eastern Time): 09/28/2019 9:39:57 AM Please select the assessment type ---Verbal order / New medication order Additional Documentation [phone] [id number] [place] Does the client directives allow for assistance with medications after hours? ---Yes Other current medications? ---Yes List current medications.  ---sertraline [phone] Medication allergies? ---No Pharmacy name and phone number. ---[phone] Does the client directive allow for RN to call in the medication order to the pharmacy? ---NoPLEASE NOTE: All timestamps contained within this report are represented as [place] Standard Time. CONFIDENTIALTY NOTICE: This fax transmission is intended only for the addressee. It contains information that is legally privileged, confidential or otherwise protected from use or disclosure. If you are not the intended recipient, you are strictly prohibited from reviewing, disclosing, copying using or disseminating any of this information or taking any action in reliance on or regarding this information. If you have received this fax in error, please notify us immediately by telephone so that we can arrange for its return to [place]. Phone: [phone], Toll-Free: [phone], Fax: [phone] Page: 2 of 2 Call Id: [id number] Guidelines Guideline Title Affirmed Question Affirmed Notes Nurse Date/Time Lamount Cohen Time) Depression [1] Depression AND [2] NO worsening or change Reinaldo Berber, RN, Ricki Rodriguez 09/28/2019 9:36:29 AM Disp. Time Lamount Cohen Time) Disposition Final User 09/28/2019 9:45:20 AM Paged On Call back to [facility], [place], Ricki Rodriguez 09/28/2019 9:39:52 AM Home Care Yes Cisneros, RN, Ricki Rodriguez Caller Disagree/Comply Disagree Caller Understands Yes PreDisposition InappropriateToAsk Care Advice Given Per Guideline HOME CARE: * You should be able to treat this at home. * Encourage the caller to talk about their problems and feelings. * Offer hope: People with depression do get through this -- even people who feel as badly as you feel now. You can be helped. CARE ADVICE given per Depression (Adult) guideline. Comments User: Bryson Corona, RN Date/Time Lamount Cohen Time): 09/28/2019 9:43:04 AM advised caller to proceed to [facility]. User: Bryson Corona, RN Date/Time Lamount Cohen Time): 09/28/2019 9:44:19  AM Message left on VM please proceed to UC User: Bryson Corona, RN Date/Time Lamount Cohen Time): 09/28/2019 9:45:50 AM Unable to page, according to directive advised to proceed to [facility]

## 2019-12-30 ENCOUNTER — Other Ambulatory Visit: Payer: Self-pay | Admitting: Family Medicine

## 2020-01-31 ENCOUNTER — Telehealth: Payer: Self-pay | Admitting: Family Medicine

## 2020-01-31 NOTE — Telephone Encounter (Signed)
Pt called and wanted to know about getting a prescrption it says he needs a office visit but he has a cpe scheduled for 03/18/20

## 2020-01-31 NOTE — Telephone Encounter (Signed)
E-scribed refill 

## 2020-03-06 MED ORDER — SERTRALINE HCL 50 MG PO TABS: ORAL_TABLET | ORAL | 0 refills | Status: DC

## 2020-03-06 NOTE — Telephone Encounter (Signed)
Pt called in he is out of his medication and his cpe isnt until 3/16

## 2020-03-06 NOTE — Telephone Encounter (Signed)
E-scribed refill 

## 2020-03-06 NOTE — Addendum Note (Signed)
Addended by: Nanci Pina on: 03/06/2020 11:22 AM   Modules accepted: Orders

## 2020-03-08 ENCOUNTER — Other Ambulatory Visit: Payer: Self-pay | Admitting: Family Medicine

## 2020-03-08 DIAGNOSIS — R7303 Prediabetes: Secondary | ICD-10-CM

## 2020-03-08 DIAGNOSIS — Z125 Encounter for screening for malignant neoplasm of prostate: Secondary | ICD-10-CM

## 2020-03-08 DIAGNOSIS — E785 Hyperlipidemia, unspecified: Secondary | ICD-10-CM

## 2020-03-11 ENCOUNTER — Other Ambulatory Visit: Payer: Self-pay

## 2020-03-11 ENCOUNTER — Other Ambulatory Visit (INDEPENDENT_AMBULATORY_CARE_PROVIDER_SITE_OTHER): Payer: BC Managed Care – PPO

## 2020-03-11 DIAGNOSIS — Z125 Encounter for screening for malignant neoplasm of prostate: Secondary | ICD-10-CM

## 2020-03-11 DIAGNOSIS — R7303 Prediabetes: Secondary | ICD-10-CM

## 2020-03-11 DIAGNOSIS — E785 Hyperlipidemia, unspecified: Secondary | ICD-10-CM

## 2020-03-11 LAB — COMPREHENSIVE METABOLIC PANEL
ALT: 20 U/L (ref 0–53)
AST: 17 U/L (ref 0–37)
Albumin: 4 g/dL (ref 3.5–5.2)
Alkaline Phosphatase: 62 U/L (ref 39–117)
BUN: 14 mg/dL (ref 6–23)
CO2: 25 mEq/L (ref 19–32)
Calcium: 9.1 mg/dL (ref 8.4–10.5)
Chloride: 105 mEq/L (ref 96–112)
Creatinine, Ser: 0.98 mg/dL (ref 0.40–1.50)
GFR: 84.83 mL/min (ref >60.00)
Glucose, Bld: 104 mg/dL — ABNORMAL HIGH (ref 70–99)
Potassium: 4.3 mEq/L (ref 3.5–5.1)
Sodium: 138 mEq/L (ref 135–145)
Total Bilirubin: 0.4 mg/dL (ref 0.2–1.2)
Total Protein: 6.5 g/dL (ref 6.0–8.3)

## 2020-03-11 LAB — LIPID PANEL
Cholesterol: 210 mg/dL — ABNORMAL HIGH (ref 0–200)
HDL: 32.4 mg/dL — ABNORMAL LOW (ref >39.00)
NonHDL: 177.31
Total CHOL/HDL Ratio: 6
Triglycerides: 352 mg/dL — ABNORMAL HIGH (ref 0.0–149.0)
VLDL: 70.4 mg/dL — ABNORMAL HIGH (ref 0.0–40.0)

## 2020-03-11 LAB — LDL CHOLESTEROL, DIRECT: Direct LDL: 139 mg/dL

## 2020-03-11 LAB — PSA: PSA: 0.5 ng/mL (ref 0.10–4.00)

## 2020-03-11 LAB — HEMOGLOBIN A1C: Hgb A1c MFr Bld: 6 % (ref 4.6–6.5)

## 2020-03-18 ENCOUNTER — Other Ambulatory Visit: Payer: Self-pay

## 2020-03-18 ENCOUNTER — Encounter: Payer: Self-pay | Admitting: Family Medicine

## 2020-03-18 ENCOUNTER — Ambulatory Visit (INDEPENDENT_AMBULATORY_CARE_PROVIDER_SITE_OTHER): Payer: BC Managed Care – PPO | Admitting: Family Medicine

## 2020-03-18 VITALS — BP 152/110 | HR 66 | Temp 97.7°F | Ht 68.0 in | Wt 218.5 lb

## 2020-03-18 DIAGNOSIS — L989 Disorder of the skin and subcutaneous tissue, unspecified: Secondary | ICD-10-CM

## 2020-03-18 DIAGNOSIS — R454 Irritability and anger: Secondary | ICD-10-CM

## 2020-03-18 DIAGNOSIS — Z0000 Encounter for general adult medical examination without abnormal findings: Secondary | ICD-10-CM | POA: Diagnosis not present

## 2020-03-18 DIAGNOSIS — E669 Obesity, unspecified: Secondary | ICD-10-CM

## 2020-03-18 DIAGNOSIS — M419 Scoliosis, unspecified: Secondary | ICD-10-CM

## 2020-03-18 DIAGNOSIS — R7303 Prediabetes: Secondary | ICD-10-CM | POA: Diagnosis not present

## 2020-03-18 DIAGNOSIS — I10 Essential (primary) hypertension: Secondary | ICD-10-CM

## 2020-03-18 DIAGNOSIS — E66811 Obesity, class 1: Secondary | ICD-10-CM

## 2020-03-18 DIAGNOSIS — E785 Hyperlipidemia, unspecified: Secondary | ICD-10-CM

## 2020-03-18 MED ORDER — SERTRALINE HCL 50 MG PO TABS: ORAL_TABLET | ORAL | 0 refills | Status: DC

## 2020-03-18 MED ORDER — AMLODIPINE BESYLATE 5 MG PO TABS: 5.0000 mg | ORAL_TABLET | Freq: Every day | ORAL | 3 refills | Status: DC

## 2020-03-18 MED ORDER — SILDENAFIL CITRATE 20 MG PO TABS: 60.0000 mg | ORAL_TABLET | Freq: Every day | ORAL | 11 refills | Status: DC | PRN

## 2020-03-18 NOTE — Progress Notes (Signed)
Patient ID: Samuel Harrington, male    DOB: 03/28/1960, 60 y.o.   MRN: [id number]  This visit was conducted in person.  BP (!) 152/110 (BP Location: Right Arm, Cuff Size: Normal)   Pulse 66   Temp 97.7 F (36.5 C) (Temporal)   Ht 5\' 8"  (1.727 m)   Wt 218 lb 8 oz (99.1 kg)   SpO2 96%   BMI 33.22 kg/m   BP Readings from Last 3 Encounters:  03/18/20 (!) 152/110  11/16/18 140/78  09/12/17 (!) 150/100    CC: CPE Subjective:   HPI: Samuel Harrington is a 60 y.o. male presenting on 03/18/2020 for Annual Exam   Stable period on sertraline 75mg  nightly for mood.  15 lb weight gain noted   Preventative: Colon cancer screening - finally submitted iFOB today. Will await results.  Prostate cancer screening - no nocturia. No fmhx.PSA reassuring - will defer DRE  Flu shot- declines  COVID vaccine Moderna 09/2019, 11/2019  Tdap 2019  Shingrix discussed  Seat belt use discussed  Sunscreen use discussed. No changing moles on skin  Non smoker, wife smokes outside Alcohol -occasional Dentist Q6 mo  Eye exam yearly  Lives with wife and daughter, 1 cat, outside dogs Occupation: promoted to 12/2019 at 2020  Activity:walking at home and at work Diet: good water, fruits/vegetablessome     Relevant past medical, surgical, family and social history reviewed and updated as indicated. Interim medical history since our last visit reviewed. Allergies and medications reviewed and updated. Outpatient Medications Prior to Visit  Medication Sig Dispense Refill  . sertraline (ZOLOFT) 50 MG tablet TAKE 1 AND 1/2 TABLET BY MOUTH EVERY NIGHT AT BEDTIME 45 tablet 0  . sildenafil (REVATIO) 20 MG tablet Take 3-5 tablets (60-100 mg total) by mouth daily as needed (relations). 30 tablet 11   No facility-administered medications prior to visit.     Per HPI unless specifically indicated in ROS section below Review of Systems  Constitutional: Negative for activity change, appetite change,  chills, fatigue, fever and unexpected weight change.  HENT: Negative for hearing loss.   Eyes: Negative for visual disturbance.  Respiratory: Negative for cough, chest tightness, shortness of breath and wheezing.   Cardiovascular: Negative for chest pain, palpitations and leg swelling.  Gastrointestinal: Negative for abdominal distention, abdominal pain, blood in stool, constipation, diarrhea, nausea and vomiting.  Genitourinary: Negative for difficulty urinating and hematuria.  Musculoskeletal: Negative for arthralgias, myalgias and neck pain.  Skin: Negative for rash.  Neurological: Positive for light-headedness. Negative for dizziness, seizures, syncope and headaches.  Hematological: Negative for adenopathy. Does not bruise/bleed easily.  Psychiatric/Behavioral: Negative for dysphoric mood. The patient is not nervous/anxious.    Objective:  BP (!) 152/110 (BP Location: Right Arm, Cuff Size: Normal)   Pulse 66   Temp 97.7 F (36.5 C) (Temporal)   Ht 5\' 8"  (1.727 m)   Wt 218 lb 8 oz (99.1 kg)   SpO2 96%   BMI 33.22 kg/m   Wt Readings from Last 3 Encounters:  03/18/20 218 lb 8 oz (99.1 kg)  11/16/18 204 lb 1 oz (92.6 kg)  09/12/17 222 lb 4 oz (100.8 kg)      Physical Exam Vitals and nursing note reviewed.  Constitutional:      General: He is not in acute distress.    Appearance: Normal appearance. He is well-developed. He is not ill-appearing.  HENT:     Head: Normocephalic and atraumatic.  Right Ear: Hearing, tympanic membrane, ear canal and external ear normal.     Left Ear: Hearing, tympanic membrane, ear canal and external ear normal.  Eyes:     General: No scleral icterus.    Extraocular Movements: Extraocular movements intact.     Conjunctiva/sclera: Conjunctivae normal.     Pupils: Pupils are equal, round, and reactive to light.  Neck:     Thyroid: No thyroid mass or thyromegaly.  Cardiovascular:     Rate and Rhythm: Normal rate and regular rhythm.     Pulses:  Normal pulses.          Radial pulses are 2+ on the right side and 2+ on the left side.     Heart sounds: Normal heart sounds. No murmur heard.   Pulmonary:     Effort: Pulmonary effort is normal. No respiratory distress.     Breath sounds: Normal breath sounds. No wheezing, rhonchi or rales.  Abdominal:     General: Abdomen is flat. Bowel sounds are normal. There is no distension.     Palpations: Abdomen is soft. There is no mass.     Tenderness: There is no abdominal tenderness. There is no guarding or rebound.     Hernia: No hernia is present.  Musculoskeletal:        General: Normal range of motion.     Cervical back: Normal range of motion and neck supple.     Right lower leg: No edema.     Left lower leg: No edema.     Comments: Thoracic scoliosis, kyphosis present  Lymphadenopathy:     Cervical: No cervical adenopathy.  Skin:    General: Skin is warm and dry.     Findings: Lesion present. No rash.     Comments: Poorly healing wound to L forearm  Neurological:     General: No focal deficit present.     Mental Status: He is alert and oriented to person, place, and time.     Comments: CN grossly intact, station and gait intact  Psychiatric:        Mood and Affect: Mood normal.        Behavior: Behavior normal.        Thought Content: Thought content normal.        Judgment: Judgment normal.       Results for orders placed or performed in visit on 03/11/20  PSA  Result Value Ref Range   PSA 0.50 0.10 - 4.00 ng/mL  Hemoglobin A1c  Result Value Ref Range   Hgb A1c MFr Bld 6.0 4.6 - 6.5 %  Comprehensive metabolic panel  Result Value Ref Range   Sodium 138 135 - 145 mEq/L   Potassium 4.3 3.5 - 5.1 mEq/L   Chloride 105 96 - 112 mEq/L   CO2 25 19 - 32 mEq/L   Glucose, Bld 104 (H) 70 - 99 mg/dL   BUN 14 6 - 23 mg/dL   Creatinine, Ser 1.00 0.40 - 1.50 mg/dL   Total Bilirubin 0.4 0.2 - 1.2 mg/dL   Alkaline Phosphatase 62 39 - 117 U/L   AST 17 0 - 37 U/L   ALT 20 0 -  53 U/L   Total Protein 6.5 6.0 - 8.3 g/dL   Albumin 4.0 3.5 - 5.2 g/dL   GFR 71.21 >97.58 mL/min   Calcium 9.1 8.4 - 10.5 mg/dL  Lipid panel  Result Value Ref Range   Cholesterol 210 (H) 0 - 200 mg/dL  Triglycerides 352.0 (H) 0.0 - 149.0 mg/dL   HDL 16.1032.40 (L) >96.04>39.00 mg/dL   VLDL 54.070.4 (H) 0.0 - 98.140.0 mg/dL   Total CHOL/HDL Ratio 6    NonHDL 177.31   LDL cholesterol, direct  Result Value Ref Range   Direct LDL 139.0 mg/dL   [facility] 03/18/2020 03/18/2020 11/16/2018 09/12/2017 02/28/2017  Decreased Interest 0 0 0 0 0  Down, Depressed, Hopeless 0 0 0 0 0  PHQ - 2 Score 0 0 0 0 0  Altered sleeping 0 - 1 - 0  Tired, decreased energy 0 - 0 - 0  Change in appetite 0 - 0 - 0  Feeling bad or failure about yourself  0 - 0 - 0  Trouble concentrating 0 - 0 - 0  Moving slowly or fidgety/restless 0 - 0 - 0  Suicidal thoughts 0 - 0 - 0  PHQ-9 Score 0 - 1 - 0    GAD 7 : Generalized Anxiety Score 03/18/2020 11/16/2018 02/28/2017  Nervous, Anxious, on Edge 0 0 1  Control/stop worrying 0 0 0  Worry too much - different things 0 0 0  Trouble relaxing 0 0 0  Restless 0 0 0  Easily annoyed or irritable 0 0 1  Afraid - awful might happen 0 0 0  Total GAD 7 Score 0 0 2   Assessment & Plan:  This visit occurred during the SARS-CoV-2 public health emergency.  Safety protocols were in place, including screening questions prior to the visit, additional usage of staff PPE, and extensive cleaning of exam room while observing appropriate contact time as indicated for disinfecting solutions.   Problem List Items Addressed This Visit    Irritability and anger    Chronic, stable period on sertraline. Desires to continue Discussed trying 50mg  dose.       Hypertension    BP persistently elevated. Start amlodipine, reviewing possible side effects to watch for. RTC 3 mo HTN f/u visit, baseline EKG at that time.       Relevant Medications   sildenafil (REVATIO) 20 MG tablet   amLODipine  (NORVASC) 5 MG tablet   Prediabetes    Reviewed diagnosis, rec limit added sugar/carbs in diet.  Encouraged weight loss to help sugars and BPs      Dyslipidemia    Chronic, deteriorated with weight gain noted. Reviewed diet choices to affect improved lipid control especially triglycerides.  The 10-year ASCVD risk score Denman George(Goff DC Montez HagemanJr., et al., 2013) is: 16.5%   Values used to calculate the score:     Age: [age] years     Sex: Male     Is Non-Hispanic African American: No     Diabetic: No     Tobacco smoker: No     Systolic Blood Pressure: 152 mmHg     Is BP treated: Yes     HDL Cholesterol: 32.4 mg/dL     Total Cholesterol: 210 mg/dL       Obesity, Class I, BMI 30-34.9    Weight gain noted. Encouraged healthy diet and lifestyle choices to affect sustainable weight loss.       Healthcare maintenance - Primary    Preventative protocols reviewed and updated unless pt declined. Discussed healthy diet and lifestyle.       Skin lesion    Poorly healing wound to left forearm. He continues picking at it. Advised to place bandaid to optimize chance to heal, rec derm eval if not healing.  Thoracic scoliosis    Endorses longstanding history of thoracic scoliosis as well as fmhx (father). Consider baseline thoracic films.           Meds ordered this encounter  Medications  . sertraline (ZOLOFT) 50 MG tablet    Sig: TAKE 1 AND 1/2 TABLET BY MOUTH EVERY NIGHT AT BEDTIME    Dispense:  45 tablet    Refill:  0  . sildenafil (REVATIO) 20 MG tablet    Sig: Take 3-5 tablets (60-100 mg total) by mouth daily as needed (relations).    Dispense:  30 tablet    Refill:  11  . amLODipine (NORVASC) 5 MG tablet    Sig: Take 1 tablet (5 mg total) by mouth daily.    Dispense:  90 tablet    Refill:  3   No orders of the defined types were placed in this encounter.   Patient instructions: Mood questionairre today.  Consider shingrix 2 shot series.  Blood pressure staying elevated - start  amlodipine 5mg  daily in the morning.  Buy home BP cuff. Goal blood pressure is <140/90  Return in 3 months for blood pressure follow up visit and 1 year for next physical.   Follow up plan: Return in about 3 months (around 06/18/2020) for follow up visit.  06/20/2020, MD

## 2020-03-18 NOTE — Patient Instructions (Addendum)
Mood questionairre today.  Consider shingrix 2 shot series.  Blood pressure staying elevated - start amlodipine 5mg  daily in the morning.  Buy home BP cuff. Goal blood pressure is <140/90  Return in 3 months for blood pressure follow up visit and 1 year for next physical.   Health Maintenance, Male Adopting a healthy lifestyle and getting preventive care are important in promoting health and wellness. Ask your health care provider about:  The right schedule for you to have regular tests and exams.  Things you can do on your own to prevent diseases and keep yourself healthy. What should I know about diet, weight, and exercise? Eat a healthy diet  Eat a diet that includes plenty of vegetables, fruits, low-fat dairy products, and lean protein.  Do not eat a lot of foods that are high in solid fats, added sugars, or sodium.   Maintain a healthy weight Body mass index (BMI) is a measurement that can be used to identify possible weight problems. It estimates body fat based on height and weight. Your health care provider can help determine your BMI and help you achieve or maintain a healthy weight. Get regular exercise Get regular exercise. This is one of the most important things you can do for your health. Most adults should:  Exercise for at least 150 minutes each week. The exercise should increase your heart rate and make you sweat (moderate-intensity exercise).  Do strengthening exercises at least twice a week. This is in addition to the moderate-intensity exercise.  Spend less time sitting. Even light physical activity can be beneficial. Watch cholesterol and blood lipids Have your blood tested for lipids and cholesterol at 59 years of age, then have this test every 5 years. You may need to have your cholesterol levels checked more often if:  Your lipid or cholesterol levels are high.  You are older than 59 years of age.  You are at high risk for heart disease. What should I know  about cancer screening? Many types of cancers can be detected early and may often be prevented. Depending on your health history and family history, you may need to have cancer screening at various ages. This may include screening for:  Colorectal cancer.  Prostate cancer.  Skin cancer.  Lung cancer. What should I know about heart disease, diabetes, and high blood pressure? Blood pressure and heart disease  High blood pressure causes heart disease and increases the risk of stroke. This is more likely to develop in people who have high blood pressure readings, are of African descent, or are overweight.  Talk with your health care provider about your target blood pressure readings.  Have your blood pressure checked: ? Every 3-5 years if you are 13-52 years of age. ? Every year if you are 75 years old or older.  If you are between the ages of 47 and 63 and are a current or former smoker, ask your health care provider if you should have a one-time screening for abdominal aortic aneurysm (AAA). Diabetes Have regular diabetes screenings. This checks your fasting blood sugar level. Have the screening done:  Once every three years after age 41 if you are at a normal weight and have a low risk for diabetes.  More often and at a younger age if you are overweight or have a high risk for diabetes. What should I know about preventing infection? Hepatitis B If you have a higher risk for hepatitis B, you should be screened for this virus.  Talk with your health care provider to find out if you are at risk for hepatitis B infection. Hepatitis C Blood testing is recommended for:  Everyone born from [birth year] through 1964.  Anyone with known risk factors for hepatitis C. Sexually transmitted infections (STIs)  You should be screened each year for STIs, including gonorrhea and chlamydia, if: ? You are sexually active and are younger than 59 years of age. ? You are older than 59 years of age and your  health care provider tells you that you are at risk for this type of infection. ? Your sexual activity has changed since you were last screened, and you are at increased risk for chlamydia or gonorrhea. Ask your health care provider if you are at risk.  Ask your health care provider about whether you are at high risk for HIV. Your health care provider may recommend a prescription medicine to help prevent HIV infection. If you choose to take medicine to prevent HIV, you should first get tested for HIV. You should then be tested every 3 months for as long as you are taking the medicine. Follow these instructions at home: Lifestyle  Do not use any products that contain nicotine or tobacco, such as cigarettes, e-cigarettes, and chewing tobacco. If you need help quitting, ask your health care provider.  Do not use street drugs.  Do not share needles.  Ask your health care provider for help if you need support or information about quitting drugs. Alcohol use  Do not drink alcohol if your health care provider tells you not to drink.  If you drink alcohol: ? Limit how much you have to 0-2 drinks a day. ? Be aware of how much alcohol is in your drink. In the [place] one drink equals one 12 oz bottle of beer (355 mL), one 5 oz glass of wine (148 mL), or one 1 oz glass of hard liquor (44 mL). General instructions  Schedule regular health, dental, and eye exams.  Stay current with your vaccines.  Tell your health care provider if: ? You often feel depressed. ? You have ever been abused or do not feel safe at home. Summary  Adopting a healthy lifestyle and getting preventive care are important in promoting health and wellness.  Follow your health care provider's instructions about healthy diet, exercising, and getting tested or screened for diseases.  Follow your health care provider's instructions on monitoring your cholesterol and blood pressure. This information is not intended to replace  advice given to you by your health care provider. Make sure you discuss any questions you have with your health care provider. Document Revised: 12/13/2017 Document Reviewed: 12/13/2017 Elsevier Patient Education  2021 [employer].

## 2020-03-18 NOTE — Assessment & Plan Note (Addendum)
Chronic, stable period on sertraline. Desires to continue Discussed trying 50mg  dose.

## 2020-03-18 NOTE — Assessment & Plan Note (Signed)
Poorly healing wound to left forearm. He continues picking at it. Advised to place bandaid to optimize chance to heal, rec derm eval if not healing.

## 2020-03-18 NOTE — Assessment & Plan Note (Signed)
BP persistently elevated. Start amlodipine, reviewing possible side effects to watch for. RTC 3 mo HTN f/u visit, baseline EKG at that time.

## 2020-03-18 NOTE — Assessment & Plan Note (Signed)
Endorses longstanding history of thoracic scoliosis as well as fmhx (father). Consider baseline thoracic films.

## 2020-03-18 NOTE — Assessment & Plan Note (Signed)
Preventative protocols reviewed and updated unless pt declined. Discussed healthy diet and lifestyle.  

## 2020-03-18 NOTE — Assessment & Plan Note (Signed)
Weight gain noted. Encouraged healthy diet and lifestyle choices to affect sustainable weight loss.  

## 2020-03-18 NOTE — Assessment & Plan Note (Signed)
Chronic, deteriorated with weight gain noted. Reviewed diet choices to affect improved lipid control especially triglycerides.  The 10-year ASCVD risk score Denman George DC Montez Hageman., et al., 2013) is: 16.5%   Values used to calculate the score:     Age: 60 years     Sex: Male     Is Non-Hispanic African American: No     Diabetic: No     Tobacco smoker: No     Systolic Blood Pressure: 152 mmHg     Is BP treated: Yes     HDL Cholesterol: 32.4 mg/dL     Total Cholesterol: 210 mg/dL

## 2020-03-18 NOTE — Assessment & Plan Note (Addendum)
Reviewed diagnosis, rec limit added sugar/carbs in diet.  Encouraged weight loss to help sugars and BPs

## 2020-03-19 ENCOUNTER — Other Ambulatory Visit: Payer: Self-pay | Admitting: Family Medicine

## 2020-03-19 ENCOUNTER — Other Ambulatory Visit (INDEPENDENT_AMBULATORY_CARE_PROVIDER_SITE_OTHER): Payer: BC Managed Care – PPO

## 2020-03-19 ENCOUNTER — Encounter: Payer: Self-pay | Admitting: Family Medicine

## 2020-03-19 DIAGNOSIS — Z1211 Encounter for screening for malignant neoplasm of colon: Secondary | ICD-10-CM

## 2020-03-19 LAB — FECAL OCCULT BLOOD, IMMUNOCHEMICAL: Fecal Occult Bld: NEGATIVE

## 2020-03-19 LAB — FECAL OCCULT BLOOD, GUAIAC: Fecal Occult Blood: NEGATIVE

## 2020-05-05 ENCOUNTER — Telehealth: Payer: Self-pay | Admitting: Family Medicine

## 2020-05-05 NOTE — Telephone Encounter (Signed)
Spoke with pt notifying him a refill was sent this morning.

## 2020-05-05 NOTE — Telephone Encounter (Signed)
  LAST APPOINTMENT DATE: 03/19/2020   NEXT APPOINTMENT DATE:@6 /20/2022  MEDICATION: sertaline  [facility]  Let patient know to contact pharmacy at the end of the day to make sure medication is ready.  Please notify patient to allow 48-72 hours to process  Encourage patient to contact the pharmacy for refills or they can request refills through [facility]  CLINICAL FILLS OUT ALL BELOW:   LAST REFILL:  QTY:  REFILL DATE:    OTHER COMMENTS:    Okay for refill?  Please advise

## 2020-06-22 ENCOUNTER — Ambulatory Visit: Payer: BC Managed Care – PPO | Admitting: Family Medicine

## 2020-06-22 DIAGNOSIS — Z0289 Encounter for other administrative examinations: Secondary | ICD-10-CM

## 2020-09-02 ENCOUNTER — Telehealth: Payer: Self-pay | Admitting: Family Medicine

## 2020-09-02 NOTE — Telephone Encounter (Signed)
Samuel Harrington called in and stated that he was charged a NO Show fee on 6/20 and he called and cancel the apt due to he had covid and they told him to not to worry and they will call to get it rescheduled.

## 2020-09-28 ENCOUNTER — Encounter: Payer: Self-pay | Admitting: Family Medicine

## 2020-09-28 ENCOUNTER — Ambulatory Visit (INDEPENDENT_AMBULATORY_CARE_PROVIDER_SITE_OTHER): Payer: BC Managed Care – PPO | Admitting: Family Medicine

## 2020-09-28 ENCOUNTER — Other Ambulatory Visit: Payer: Self-pay

## 2020-09-28 VITALS — BP 156/108 | HR 69 | Temp 97.9°F | Ht 68.0 in | Wt 229.0 lb

## 2020-09-28 DIAGNOSIS — E669 Obesity, unspecified: Secondary | ICD-10-CM | POA: Diagnosis not present

## 2020-09-28 DIAGNOSIS — I10 Essential (primary) hypertension: Secondary | ICD-10-CM

## 2020-09-28 NOTE — Assessment & Plan Note (Signed)
Weight gain again noted. Reviewed relation of obesity to hypertension, encouraged ongoing weight loss efforts.

## 2020-09-28 NOTE — Progress Notes (Signed)
Patient ID: Samuel Harrington, male    DOB: 02-28-1960, 60 y.o.   MRN: [id number]  This visit was conducted in person.  BP (!) 156/108 (BP Location: Right Arm, Cuff Size: Large)   Pulse 69   Temp 97.9 F (36.6 C) (Temporal)   Ht 5\' 8"  (1.727 m)   Wt 229 lb (103.9 kg)   SpO2 96%   BMI 34.82 kg/m    CC: HTN f/u visit  Subjective:   HPI: Samuel Harrington is a 60 y.o. male presenting on 09/28/2020 for Blood Pressure Check   See prior note for details. Seen here for CPE 03/2020 - BP at that time elevated to 152/110 (in setting of weight gain) so amlodipine started. Ongoing weight gain noted.   HTN - not as regular with current antihypertensive regimen of amlodipine 5mg  daily.  Does not regularly check blood pressures at home: 120/90s. No low blood pressure readings or symptoms of dizziness/syncope. Denies HA, vision changes, CP/tightness, SOB, leg swelling.      Relevant past medical, surgical, family and social history reviewed and updated as indicated. Interim medical history since our last visit reviewed. Allergies and medications reviewed and updated. Outpatient Medications Prior to Visit  Medication Sig Dispense Refill   amLODipine (NORVASC) 5 MG tablet Take 1 tablet (5 mg total) by mouth daily. 90 tablet 3   sertraline (ZOLOFT) 50 MG tablet TAKE 1 AND 1/2 TABLET BY MOUTH EVERY NIGHT AT BEDTIME. 135 tablet 1   sildenafil (REVATIO) 20 MG tablet Take 3-5 tablets (60-100 mg total) by mouth daily as needed (relations). 30 tablet 11   No facility-administered medications prior to visit.     Per HPI unless specifically indicated in ROS section below Review of Systems  Objective:  BP (!) 156/108 (BP Location: Right Arm, Cuff Size: Large)   Pulse 69   Temp 97.9 F (36.6 C) (Temporal)   Ht 5\' 8"  (1.727 m)   Wt 229 lb (103.9 kg)   SpO2 96%   BMI 34.82 kg/m   Wt Readings from Last 3 Encounters:  09/28/20 229 lb (103.9 kg)  03/18/20 218 lb 8 oz (99.1 kg)  11/16/18 204 lb 1 oz  (92.6 kg)      Physical Exam Vitals and nursing note reviewed.  Constitutional:      Appearance: Normal appearance. He is not ill-appearing.  Eyes:     Extraocular Movements: Extraocular movements intact.     Pupils: Pupils are equal, round, and reactive to light.  Neck:     Thyroid: No thyroid mass or thyromegaly.  Cardiovascular:     Rate and Rhythm: Normal rate and regular rhythm.     Pulses: Normal pulses.     Heart sounds: Normal heart sounds. No murmur heard. Pulmonary:     Effort: Pulmonary effort is normal. No respiratory distress.     Breath sounds: Normal breath sounds. No wheezing, rhonchi or rales.  Musculoskeletal:     Cervical back: Normal range of motion and neck supple. No rigidity.     Right lower leg: No edema.     Left lower leg: No edema.  Lymphadenopathy:     Cervical: No cervical adenopathy.  Skin:    General: Skin is warm and dry.     Findings: No rash.  Neurological:     Mental Status: He is alert.  Psychiatric:        Mood and Affect: Mood normal.        Behavior: Behavior normal.  Results for orders placed or performed in visit on 03/19/20  Fecal Occult Blood, Guaiac  Result Value Ref Range   Fecal Occult Blood Negative    EKG - NSR rate 60, normal axis, intervals, no hypertrophy or acute ST/T changes  Assessment & Plan:  This visit occurred during the SARS-CoV-2 public health emergency.  Safety protocols were in place, including screening questions prior to the visit, additional usage of staff PPE, and extensive cleaning of exam room while observing appropriate contact time as indicated for disinfecting solutions.   Problem List Items Addressed This Visit     Hypertension - Primary    Remaining elevated - he has not been regular with amlodipine. Advised take daily, start monitoring BP more closely a few times a week and keep BP log, call us with readings in 2-3 wks. RTC 2 mo HTN f/u visit, prior to needing to renew CDL.  Baseline EKG  today.       Relevant Orders   EKG 12-Lead (Completed)   Obesity, Class I, BMI 30-34.9    Weight gain again noted. Reviewed relation of obesity to hypertension, encouraged ongoing weight loss efforts.         No orders of the defined types were placed in this encounter.  Orders Placed This Encounter  Procedures   EKG 12-Lead      Patient Instructions  Baseline EKG today.  BP staying elevated - continue amlodipine 5mg  every day.  I'd like you to keep a blood pressure log, jot down numbers a few times a week and send me results in 2-3 weeks.  Return in 6 months for physical.  Return in 2 months for BP follow up.   Follow up plan: Return in about 2 months (around 11/28/2020) for annual exam, prior fasting for blood work, follow up visit.  11/30/2020, MD

## 2020-09-28 NOTE — Patient Instructions (Addendum)
Baseline EKG today.  BP staying elevated - continue amlodipine 5mg  every day.  I'd like you to keep a blood pressure log, jot down numbers a few times a week and send me results in 2-3 weeks.  Return in 6 months for physical.  Return in 2 months for BP follow up.

## 2020-09-28 NOTE — Assessment & Plan Note (Addendum)
Remaining elevated - he has not been regular with amlodipine. Advised take daily, start monitoring BP more closely a few times a week and keep BP log, call us with readings in 2-3 wks. 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

## 2020-12-01 ENCOUNTER — Ambulatory Visit: Payer: BC Managed Care – PPO | Admitting: Family Medicine

## 2020-12-06 ENCOUNTER — Other Ambulatory Visit: Payer: Self-pay | Admitting: Family Medicine

## 2020-12-08 ENCOUNTER — Ambulatory Visit: Payer: BC Managed Care – PPO | Admitting: Family Medicine

## 2020-12-11 ENCOUNTER — Other Ambulatory Visit: Payer: Self-pay

## 2020-12-11 ENCOUNTER — Encounter: Payer: Self-pay | Admitting: Family Medicine

## 2020-12-11 ENCOUNTER — Ambulatory Visit (INDEPENDENT_AMBULATORY_CARE_PROVIDER_SITE_OTHER): Payer: BC Managed Care – PPO | Admitting: Family Medicine

## 2020-12-11 VITALS — BP 134/90 | HR 80 | Temp 97.5°F | Ht 68.0 in | Wt 223.2 lb

## 2020-12-11 DIAGNOSIS — R454 Irritability and anger: Secondary | ICD-10-CM | POA: Diagnosis not present

## 2020-12-11 DIAGNOSIS — I10 Essential (primary) hypertension: Secondary | ICD-10-CM

## 2020-12-11 MED ORDER — BENAZEPRIL HCL 10 MG PO TABS: 10.0000 mg | ORAL_TABLET | Freq: Every day | ORAL | 6 refills | Status: DC

## 2020-12-11 MED ORDER — AMLODIPINE BESYLATE 5 MG PO TABS: 5.0000 mg | ORAL_TABLET | Freq: Every day | ORAL | 6 refills | Status: DC

## 2020-12-11 MED ORDER — AMLODIPINE BESY-BENAZEPRIL HCL 5-10 MG PO CAPS: 1.0000 | ORAL_CAPSULE | Freq: Every day | ORAL | 6 refills | Status: DC

## 2020-12-11 MED ORDER — SERTRALINE HCL 50 MG PO TABS: ORAL_TABLET | ORAL | 3 refills | Status: DC

## 2020-12-11 NOTE — Patient Instructions (Addendum)
Continue amlodipine, start benazepril 10mg  daily. Schedule lab visit in 10-14 days (may do at [facility]) for kidney check.  Your goal blood pressure is <140/90, ideally <130/80. Work on low salt/sodium diet - goal <2gm (2,000mg ) per day. Eat a diet high in fruits/vegetables and whole grains.  Look into mediterranean and DASH diet. Goal activity is 157min/wk of moderate intensity exercise.  This can be split into 30 minute chunks.  If you are not at this level, you can start with smaller 10-15 min increments and slowly build up activity. Look at www.heart.org for more resources.   DASH Eating Plan DASH stands for Dietary Approaches to Stop Hypertension. The DASH eating plan is a healthy eating plan that has been shown to: Reduce high blood pressure (hypertension). Reduce your risk for type 2 diabetes, heart disease, and stroke. Help with weight loss. What are tips for following this plan? Reading food labels Check food labels for the amount of salt (sodium) per serving. Choose foods with less than 5 percent of the Daily Value of sodium. Generally, foods with less than 300 milligrams (mg) of sodium per serving fit into this eating plan. To find whole grains, look for the word "whole" as the first word in the ingredient list. Shopping Buy products labeled as "low-sodium" or "no salt added." Buy fresh foods. Avoid canned foods and pre-made or frozen meals. Cooking Avoid adding salt when cooking. Use salt-free seasonings or herbs instead of table salt or sea salt. Check with your health care provider or pharmacist before using salt substitutes. Do not fry foods. Cook foods using healthy methods such as baking, boiling, grilling, roasting, and broiling instead. Cook with heart-healthy oils, such as olive, canola, avocado, soybean, or sunflower oil. Meal planning  Eat a balanced diet that includes: 4 or more servings of fruits and 4 or more servings of vegetables each day. Try to fill  one-half of your plate with fruits and vegetables. 6-8 servings of whole grains each day. Less than 6 oz (170 g) of lean meat, poultry, or fish each day. A 3-oz (85-g) serving of meat is about the same size as a deck of cards. One egg equals 1 oz (28 g). 2-3 servings of low-fat dairy each day. One serving is 1 cup (237 mL). 1 serving of nuts, seeds, or beans 5 times each week. 2-3 servings of heart-healthy fats. Healthy fats called omega-3 fatty acids are found in foods such as walnuts, flaxseeds, fortified milks, and eggs. These fats are also found in cold-water fish, such as sardines, salmon, and mackerel. Limit how much you eat of: Canned or prepackaged foods. Food that is high in trans fat, such as some fried foods. Food that is high in saturated fat, such as fatty meat. Desserts and other sweets, sugary drinks, and other foods with added sugar. Full-fat dairy products. Do not salt foods before eating. Do not eat more than 4 egg yolks a week. Try to eat at least 2 vegetarian meals a week. Eat more home-cooked food and less restaurant, buffet, and fast food. Lifestyle When eating at a restaurant, ask that your food be prepared with less salt or no salt, if possible. If you drink alcohol: Limit how much you use to: 0-1 drink a day for women who are not pregnant. 0-2 drinks a day for men. Be aware of how much alcohol is in your drink. In the [place] one drink equals one 12 oz bottle of beer (355 mL), one 5 oz glass of wine (  148 mL), or one 1 oz glass of hard liquor (44 mL). General information Avoid eating more than 2,300 mg of salt a day. If you have hypertension, you may need to reduce your sodium intake to 1,500 mg a day. Work with your health care provider to maintain a healthy body weight or to lose weight. Ask what an ideal weight is for you. Get at least 30 minutes of exercise that causes your heart to beat faster (aerobic exercise) most days of the week. Activities may include  walking, swimming, or biking. Work with your health care provider or dietitian to adjust your eating plan to your individual calorie needs. What foods should I eat? Fruits All fresh, dried, or frozen fruit. Canned fruit in natural juice (without added sugar). Vegetables Fresh or frozen vegetables (raw, steamed, roasted, or grilled). Low-sodium or reduced-sodium tomato and vegetable juice. Low-sodium or reduced-sodium tomato sauce and tomato paste. Low-sodium or reduced-sodium canned vegetables. Grains Whole-grain or whole-wheat bread. Whole-grain or whole-wheat pasta. Brown rice. [place]. Bulgur. Whole-grain and low-sodium cereals. Pita bread. Low-fat, low-sodium crackers. Whole-wheat flour tortillas. Meats and other proteins Skinless chicken or [place]. Ground chicken or [place]. Pork with fat trimmed off. Fish and seafood. Egg whites. Dried beans, peas, or lentils. Unsalted nuts, nut butters, and seeds. Unsalted canned beans. Lean cuts of beef with fat trimmed off. Low-sodium, lean precooked or cured meat, such as sausages or meat loaves. Dairy Low-fat (1%) or fat-free (skim) milk. Reduced-fat, low-fat, or fat-free cheeses. Nonfat, low-sodium ricotta or cottage cheese. Low-fat or nonfat yogurt. Low-fat, low-sodium cheese. Fats and oils Soft margarine without trans fats. Vegetable oil. Reduced-fat, low-fat, or light mayonnaise and salad dressings (reduced-sodium). Canola, safflower, olive, avocado, soybean, and sunflower oils. Avocado. Seasonings and condiments Herbs. Spices. Seasoning mixes without salt. Other foods Unsalted popcorn and pretzels. Fat-free sweets. The items listed above may not be a complete list of foods and beverages you can eat. Contact a dietitian for more information. What foods should I avoid? Fruits Canned fruit in a light or heavy syrup. Fried fruit. Fruit in cream or butter sauce. Vegetables Creamed or fried vegetables. Vegetables in a cheese sauce. Regular  canned vegetables (not low-sodium or reduced-sodium). Regular canned tomato sauce and paste (not low-sodium or reduced-sodium). Regular tomato and vegetable juice (not low-sodium or reduced-sodium). Angie Fava. Olives. Grains Baked goods made with fat, such as croissants, muffins, or some breads. Dry pasta or rice meal packs. Meats and other proteins Fatty cuts of meat. Ribs. Fried meat. Berniece Salines. Bologna, salami, and other precooked or cured meats, such as sausages or meat loaves. Fat from the back of a pig (fatback). Bratwurst. Salted nuts and seeds. Canned beans with added salt. Canned or smoked fish. Whole eggs or egg yolks. Chicken or [place] with skin. Dairy Whole or 2% milk, cream, and half-and-half. Whole or full-fat cream cheese. Whole-fat or sweetened yogurt. Full-fat cheese. Nondairy creamers. Whipped toppings. Processed cheese and cheese spreads. Fats and oils Butter. Stick margarine. Lard. Shortening. Ghee. Bacon fat. Tropical oils, such as coconut, palm kernel, or palm oil. Seasonings and condiments Onion salt, garlic salt, seasoned salt, table salt, and sea salt. Worcestershire sauce. Tartar sauce. Barbecue sauce. Teriyaki sauce. Soy sauce, including reduced-sodium. Steak sauce. Canned and packaged gravies. Fish sauce. Oyster sauce. Cocktail sauce. Store-bought horseradish. Ketchup. Mustard. Meat flavorings and tenderizers. Bouillon cubes. Hot sauces. Pre-made or packaged marinades. Pre-made or packaged taco seasonings. Relishes. Regular salad dressings. Other foods Salted popcorn and pretzels. The items listed above may not be  a complete list of foods and beverages you should avoid. Contact a dietitian for more information. Where to find more information National Heart, Lung, and Blood Institute: PopSteam.is American Heart Association: www.heart.org Academy of Nutrition and Dietetics: www.eatright.org National Kidney Foundation: www.kidney.org Summary The DASH eating plan is a  healthy eating plan that has been shown to reduce high blood pressure (hypertension). It may also reduce your risk for type 2 diabetes, heart disease, and stroke. When on the DASH eating plan, aim to eat more fresh fruits and vegetables, whole grains, lean proteins, low-fat dairy, and heart-healthy fats. With the DASH eating plan, you should limit salt (sodium) intake to 2,300 mg a day. If you have hypertension, you may need to reduce your sodium intake to 1,500 mg a day. Work with your health care provider or dietitian to adjust your eating plan to your individual calorie needs. This information is not intended to replace advice given to you by your health care provider. Make sure you discuss any questions you have with your health care provider. Document Revised: 11/23/2018 Document Reviewed: 11/23/2018 Elsevier Patient Education  2022 [employer].

## 2020-12-11 NOTE — Assessment & Plan Note (Signed)
Chronic mildly elevated readings that improve on recheck.  Will add benazepril 10mg  daily and recheck Cr in 2 wks. Discussed monitoring for dry cough, angioedema.  Reviewed low salt diet, increased aerobic exercise, handout provided.  Update with effect.

## 2020-12-11 NOTE — Progress Notes (Signed)
Patient ID: Samuel Harrington, male    DOB: 1960-08-10, 60 y.o.   MRN: [id number]  This visit was conducted in person.  BP 134/90 (BP Location: Left Arm, Cuff Size: Large)   Pulse 80   Temp (!) 97.5 F (36.4 C) (Temporal)   Ht 5\' 8"  (1.727 m)   Wt 223 lb 4 oz (101.3 kg)   SpO2 96%   BMI 33.95 kg/m    CC: HTN f/u visit  Subjective:   HPI: Samuel Harrington is a 60 y.o. male presenting on 12/11/2020 for Hypertension (Here for f/u.)   Recent flu like illness - getting better.   HTN - compliant with current antihypertensive regimen of amlodipine 5mg  daily.  Does not check blood pressures at home: needs to buy new cuff. BP on presentation 150s/90, similar to when he checks at [facility], however BP drops on recheck in office. No low blood pressure readings or symptoms of dizziness/syncope. Denies HA, vision changes, CP/tightness, SOB, leg swelling.   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   7lb weight loss noted - trying to eat healthier.  Caffeine - drinks 3-4 cups in the morning.  Already limits salt in diet.   Longstanding use of sertraline for irritability/anxiety, tolerates med well without concerns.      Relevant past medical, surgical, family and social history reviewed and updated as indicated. Interim medical history since our last visit reviewed. Allergies and medications reviewed and updated. Outpatient Medications Prior to Visit  Medication Sig Dispense Refill   sildenafil (REVATIO) 20 MG tablet Take 3-5 tablets (60-100 mg total) by mouth daily as needed (relations). 30 tablet 11   amLODipine (NORVASC) 5 MG tablet Take 1 tablet (5 mg total) by mouth daily. 90 tablet 3   sertraline (ZOLOFT) 50 MG tablet TAKE 1.5 TABLETS BY MOUTH EVERY NIGHT AT BEDTIME 135 tablet 0   No facility-administered medications prior to visit.     Per HPI unless specifically indicated in ROS section below Review of Systems  Objective:  BP 134/90 (BP Location: Left Arm, Cuff Size: Large)   Pulse 80    Temp (!) 97.5 F (36.4 C) (Temporal)   Ht 5\' 8"  (1.727 m)   Wt 223 lb 4 oz (101.3 kg)   SpO2 96%   BMI 33.95 kg/m   Wt Readings from Last 3 Encounters:  12/11/20 223 lb 4 oz (101.3 kg)  09/28/20 229 lb (103.9 kg)  03/18/20 218 lb 8 oz (99.1 kg)      Physical Exam Vitals and nursing note reviewed.  Constitutional:      Appearance: Normal appearance. He is not ill-appearing.  Eyes:     Extraocular Movements: Extraocular movements intact.     Conjunctiva/sclera: Conjunctivae normal.     Pupils: Pupils are equal, round, and reactive to light.  Cardiovascular:     Rate and Rhythm: Normal rate and regular rhythm.     Pulses: Normal pulses.     Heart sounds: Normal heart sounds. No murmur heard. Pulmonary:     Effort: Pulmonary effort is normal. No respiratory distress.     Breath sounds: Normal breath sounds. No wheezing, rhonchi or rales.  Musculoskeletal:     Right lower leg: No edema.     Left lower leg: No edema.  Skin:    General: Skin is warm and dry.     Findings: No rash.  Neurological:     Mental Status: He is alert.  Psychiatric:  Mood and Affect: Mood normal.        Behavior: Behavior normal.      Results for orders placed or performed in visit on 03/19/20  Fecal Occult Blood, Guaiac  Result Value Ref Range   Fecal Occult Blood Negative    Depression screen [facility] 2/9 03/18/2020 03/18/2020 11/16/2018 09/12/2017 02/28/2017  Decreased Interest 0 0 0 0 0  Down, Depressed, Hopeless 0 0 0 0 0  PHQ - 2 Score 0 0 0 0 0  Altered sleeping 0 - 1 - 0  Tired, decreased energy 0 - 0 - 0  Change in appetite 0 - 0 - 0  Feeling bad or failure about yourself  0 - 0 - 0  Trouble concentrating 0 - 0 - 0  Moving slowly or fidgety/restless 0 - 0 - 0  Suicidal thoughts 0 - 0 - 0  PHQ-9 Score 0 - 1 - 0   GAD 7 : Generalized Anxiety Score 03/18/2020 11/16/2018 02/28/2017  Nervous, Anxious, on Edge 0 0 1  Control/stop worrying 0 0 0  Worry too much - different things 0 0 0   Trouble relaxing 0 0 0  Restless 0 0 0  Easily annoyed or irritable 0 0 1  Afraid - awful might happen 0 0 0  Total GAD 7 Score 0 0 2   Assessment & Plan:  This visit occurred during the SARS-CoV-2 public health emergency.  Safety protocols were in place, including screening questions prior to the visit, additional usage of staff PPE, and extensive cleaning of exam room while observing appropriate contact time as indicated for disinfecting solutions.   Problem List Items Addressed This Visit     Irritability and anger    Longstanding history of this predominantly driven by anxiety, longterm on sertraline 75mg  daily and tolerates this well. Has declined trial off medication.       Hypertension - Primary    Chronic mildly elevated readings that improve on recheck.  Will add benazepril 10mg  daily and recheck Cr in 2 wks. Discussed monitoring for dry cough, angioedema.  Reviewed low salt diet, increased aerobic exercise, handout provided.  Update with effect.       Relevant Medications   amLODipine (NORVASC) 5 MG tablet   benazepril (LOTENSIN) 10 MG tablet   Other Relevant Orders   Basic metabolic panel     Meds ordered this encounter  Medications   sertraline (ZOLOFT) 50 MG tablet    Sig: TAKE 1.5 TABLETS BY MOUTH EVERY NIGHT AT BEDTIME    Dispense:  135 tablet    Refill:  3   DISCONTD: amLODipine-benazepril (LOTREL) 5-10 MG capsule    Sig: Take 1 capsule by mouth daily.    Dispense:  30 capsule    Refill:  6    To replace plain amlodipine   amLODipine (NORVASC) 5 MG tablet    Sig: Take 1 tablet (5 mg total) by mouth daily.    Dispense:  30 tablet    Refill:  6    Fill this in place of lotrel   benazepril (LOTENSIN) 10 MG tablet    Sig: Take 1 tablet (10 mg total) by mouth daily.    Dispense:  30 tablet    Refill:  6    Fill this in place of lotrel   Orders Placed This Encounter  Procedures   Basic metabolic panel    Standing Status:   Future    Standing  Expiration Date:   12/11/2021  Patient instructions: Continue amlodipine, start benazepril 10mg  daily. Schedule lab visit in 10-14 days (may do at [facility]) for kidney check.  Your goal blood pressure is <140/90, ideally <130/80. Work on low salt/sodium diet - goal <2gm (2,000mg ) per day. Eat a diet high in fruits/vegetables and whole grains.  Look into mediterranean and DASH diet. Goal activity is 130min/wk of moderate intensity exercise.  This can be split into 30 minute chunks.  If you are not at this level, you can start with smaller 10-15 min increments and slowly build up activity. Look at www.heart.org for more resources.   Follow up plan: Return in about 3 months (around 03/17/2021), or if symptoms worsen or fail to improve, for follow up visit.  03/19/2021, MD

## 2020-12-11 NOTE — Assessment & Plan Note (Addendum)
Longstanding history of this predominantly driven by anxiety, longterm on sertraline 75mg  daily and tolerates this well. Has declined trial off medication.

## 2020-12-17 ENCOUNTER — Emergency Department: Payer: BC Managed Care – PPO

## 2020-12-17 ENCOUNTER — Telehealth: Payer: Self-pay

## 2020-12-17 ENCOUNTER — Emergency Department
Admission: EM | Admit: 2020-12-17 | Discharge: 2020-12-17 | Disposition: A | Discharge status: Home / Self Care | Payer: BC Managed Care – PPO | Attending: Emergency Medicine | Admitting: Emergency Medicine

## 2020-12-17 ENCOUNTER — Encounter: Payer: Self-pay | Admitting: Emergency Medicine

## 2020-12-17 ENCOUNTER — Other Ambulatory Visit: Payer: Self-pay

## 2020-12-17 DIAGNOSIS — E860 Dehydration: Secondary | ICD-10-CM | POA: Diagnosis not present

## 2020-12-17 DIAGNOSIS — I10 Essential (primary) hypertension: Secondary | ICD-10-CM | POA: Diagnosis not present

## 2020-12-17 DIAGNOSIS — Z20822 Contact with and (suspected) exposure to covid-19: Secondary | ICD-10-CM | POA: Insufficient documentation

## 2020-12-17 DIAGNOSIS — J101 Influenza due to other identified influenza virus with other respiratory manifestations: Secondary | ICD-10-CM | POA: Diagnosis not present

## 2020-12-17 DIAGNOSIS — R42 Dizziness and giddiness: Secondary | ICD-10-CM | POA: Diagnosis present

## 2020-12-17 DIAGNOSIS — R55 Syncope and collapse: Secondary | ICD-10-CM | POA: Insufficient documentation

## 2020-12-17 DIAGNOSIS — Z79899 Other long term (current) drug therapy: Secondary | ICD-10-CM | POA: Diagnosis not present

## 2020-12-17 DIAGNOSIS — R197 Diarrhea, unspecified: Secondary | ICD-10-CM | POA: Diagnosis not present

## 2020-12-17 LAB — URINALYSIS, ROUTINE W REFLEX MICROSCOPIC
Bilirubin Urine: NEGATIVE
Glucose, UA: NEGATIVE mg/dL
Hgb urine dipstick: NEGATIVE
Ketones, ur: NEGATIVE mg/dL
Leukocytes,Ua: NEGATIVE
Nitrite: NEGATIVE
Protein, ur: NEGATIVE mg/dL
Specific Gravity, Urine: 1.02 (ref 1.005–1.030)
pH: 5.5 (ref 5.0–8.0)

## 2020-12-17 LAB — CBC
HCT: 45.8 % (ref 39.0–52.0)
Hemoglobin: 15.1 g/dL (ref 13.0–17.0)
MCH: 31.2 pg (ref 26.0–34.0)
MCHC: 33 g/dL (ref 30.0–36.0)
MCV: 94.6 fL (ref 80.0–100.0)
Platelets: 253 10*3/uL (ref 150–400)
RBC: 4.84 MIL/uL (ref 4.22–5.81)
RDW: 12.4 % (ref 11.5–15.5)
WBC: 6.7 10*3/uL (ref 4.0–10.5)
nRBC: 0 % (ref 0.0–0.2)

## 2020-12-17 LAB — BASIC METABOLIC PANEL
Anion gap: 9 (ref 5–15)
BUN: 15 mg/dL (ref 6–20)
CO2: 24 mmol/L (ref 22–32)
Calcium: 8.7 mg/dL — ABNORMAL LOW (ref 8.9–10.3)
Chloride: 100 mmol/L (ref 98–111)
Creatinine, Ser: 1.3 mg/dL — ABNORMAL HIGH (ref 0.61–1.24)
GFR, Estimated: >60 mL/min (ref >60)
Glucose, Bld: 172 mg/dL — ABNORMAL HIGH (ref 70–99)
Potassium: 4.2 mmol/L (ref 3.5–5.1)
Sodium: 133 mmol/L — ABNORMAL LOW (ref 135–145)

## 2020-12-17 LAB — TROPONIN I (HIGH SENSITIVITY): Troponin I (High Sensitivity): 14 ng/L (ref <18)

## 2020-12-17 LAB — RESP PANEL BY RT-PCR (FLU A&B, COVID) ARPGX2
Influenza A by PCR: POSITIVE — AB
Influenza B by PCR: NEGATIVE
SARS Coronavirus 2 by RT PCR: NEGATIVE

## 2020-12-17 MED ORDER — LACTATED RINGERS IV BOLUS
1000.0000 mL | Freq: Once | INTRAVENOUS | Status: AC
  Administered 2020-12-17: 1000 mL via INTRAVENOUS

## 2020-12-17 NOTE — ED Triage Notes (Signed)
Pt reports that he was at home became dizzy and hot and passed out. He was sitting on the toilet having a BM and the next thing he knew he was on the floor.

## 2020-12-17 NOTE — Discharge Instructions (Addendum)
Please increase your fluid intake. Follow up with primary care. Return to the ER for symptoms that change or worsen.

## 2020-12-17 NOTE — Telephone Encounter (Signed)
Noted, yes ER was necessary and or 911.
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
## 2020-12-17 NOTE — Telephone Encounter (Signed)
Noted. Recently started on benazepril.  Plz call tomorrow for update on symptoms and ER evaluation.

## 2020-12-17 NOTE — Telephone Encounter (Signed)
Per chart review tab pt is at [facility] ED. Sending note to Dr Reece Agar who is out of office and Mort Sawyers FNP who is in office.will also send to [facility] CMA.

## 2020-12-18 LAB — CBG MONITORING, ED: Glucose-Capillary: 174 mg/dL — ABNORMAL HIGH (ref 70–99)

## 2020-12-18 NOTE — Telephone Encounter (Signed)
Lvm asking pt to call back.  Need to get update on pt.  

## 2020-12-21 NOTE — Telephone Encounter (Signed)
Pt called back he said that he is doing better, but would like a call back before he has labs

## 2020-12-21 NOTE — Telephone Encounter (Signed)
Lvm asking pt to call back.  Need to get update on pt.  

## 2020-12-22 NOTE — Telephone Encounter (Addendum)
Rtn pt's call.  Pt is asking is it ok to restart new BP med and how long should he be on it before r/s lab visit.  Plz advise.   C/x 12/24/20 lab visit since he's been off of med for awhile.

## 2020-12-22 NOTE — Telephone Encounter (Signed)
How are blood pressures at home? If consistently >140/90, rec start 1/2 tab benazepril (total 5mg  dose) daily, update with effect.

## 2020-12-23 NOTE — Telephone Encounter (Signed)
Spoke with pt asking about home BP readings.  Says it's staying around 130s/80s.  I relayed Dr. Timoteo Expose message.  Pt verbalizes understanding.

## 2020-12-24 ENCOUNTER — Other Ambulatory Visit: Payer: BC Managed Care – PPO

## 2021-12-13 ENCOUNTER — Other Ambulatory Visit: Payer: Self-pay | Admitting: Family Medicine

## 2021-12-13 DIAGNOSIS — I10 Essential (primary) hypertension: Secondary | ICD-10-CM

## 2021-12-13 NOTE — Telephone Encounter (Signed)
E-scribed refill.  Plz schedule CPE and lab visits for additional refills.  

## 2021-12-14 NOTE — Telephone Encounter (Signed)
Noted  

## 2021-12-14 NOTE — Telephone Encounter (Signed)
Patient scheduloed

## 2021-12-20 ENCOUNTER — Telehealth: Payer: Self-pay | Admitting: Family Medicine

## 2021-12-20 NOTE — Telephone Encounter (Signed)
Spoke with pt notifying him the form is ready to pick up. Expresses his thanks and will pick up tomorrow.   [Placed form at front office.]

## 2021-12-20 NOTE — Telephone Encounter (Signed)
Placed form in Dr. G's box.  

## 2021-12-20 NOTE — Telephone Encounter (Signed)
Patient came in and dropped off paperwork that needs to be signed by Dr. Reece Agar. Placed in his box. Thank you!

## 2021-12-20 NOTE — Telephone Encounter (Signed)
Filled and in my outbox

## 2021-12-22 NOTE — Telephone Encounter (Signed)
Patient came in and picked up paperwork

## 2021-12-22 NOTE — Telephone Encounter (Signed)
Noted  

## 2021-12-28 ENCOUNTER — Encounter: Payer: Self-pay | Admitting: Family Medicine

## 2021-12-28 ENCOUNTER — Ambulatory Visit (INDEPENDENT_AMBULATORY_CARE_PROVIDER_SITE_OTHER): Payer: BC Managed Care – PPO | Admitting: Family Medicine

## 2021-12-28 VITALS — BP 158/100 | HR 79 | Temp 97.5°F | Ht 69.0 in | Wt 220.8 lb

## 2021-12-28 DIAGNOSIS — E669 Obesity, unspecified: Secondary | ICD-10-CM

## 2021-12-28 DIAGNOSIS — I10 Essential (primary) hypertension: Secondary | ICD-10-CM | POA: Diagnosis not present

## 2021-12-28 MED ORDER — AMLODIPINE BESY-BENAZEPRIL HCL 5-20 MG PO CAPS: 1.0000 | ORAL_CAPSULE | Freq: Every day | ORAL | 3 refills | Status: DC

## 2021-12-28 NOTE — Progress Notes (Signed)
Patient ID: Samuel Harrington, male    DOB: [birth year], 61 y.o.   MRN: [id number]  This visit was conducted in person.  BP (!) 158/100 (BP Location: Right Arm, Cuff Size: Large)   Pulse 79   Temp (!) 97.5 F (36.4 C) (Temporal)   Ht 5\' 9"  (1.753 m)   Wt 220 lb 12.8 oz (100.2 kg)   SpO2 97%   BMI 32.61 kg/m   BP Readings from Last 3 Encounters:  12/28/21 (!) 158/100  12/17/20 132/84  12/11/20 134/90   CC: HTN f/u visit  Subjective:   HPI: Samuel Harrington is a 61 y.o. male presenting on 12/28/2021 for Hypertension (Here for f/u. C/o recent BP readings higher than usual. Pt brought in home BP monitor to compare. Monitor will not read today. )   HTN - Compliant with current antihypertensive regimen of amlodipine 5mg  and benazepril 10mg  daily.  Does check blood pressures at home: elevated readings over the past 6 months. No low blood pressure readings or symptoms of dizziness/syncope. Denies HA, vision changes, CP/tightness, SOB, leg swelling.   Recent BP check at [employer] physical initially 171/123, on recheck 149/88. Only received 3 month card.   No increased salt/sodium in diet.  Since then, he's drinking plenty of flavored water propel and has cut down on alcohol and sodas.  Does not increased anxiety when coming to doctor's office.  Notes increased stressors - at home, work.   Brief observational stay 10/2021 at [facility] for syncopal episode. This was in setting of dehydration due to acute diarrheal illness.     Relevant past medical, surgical, family and social history reviewed and updated as indicated. Interim medical history since our last visit reviewed. Allergies and medications reviewed and updated. Outpatient Medications Prior to Visit  Medication Sig Dispense Refill   sertraline (ZOLOFT) 50 MG tablet TAKE 1.5 TABLETS BY MOUTH EVERY NIGHT AT BEDTIME 135 tablet 3   sildenafil (REVATIO) 20 MG tablet Take 3-5 tablets (60-100 mg total) by mouth daily as needed (relations). 30  tablet 11   amLODipine (NORVASC) 5 MG tablet TAKE ONE TABLET BY MOUTH DAILY 30 tablet 2   benazepril (LOTENSIN) 10 MG tablet Take 1 tablet (10 mg total) by mouth daily. 30 tablet 6   No facility-administered medications prior to visit.     Per HPI unless specifically indicated in ROS section below Review of Systems  Objective:  BP (!) 158/100 (BP Location: Right Arm, Cuff Size: Large)   Pulse 79   Temp (!) 97.5 F (36.4 C) (Temporal)   Ht 5\' 9"  (1.753 m)   Wt 220 lb 12.8 oz (100.2 kg)   SpO2 97%   BMI 32.61 kg/m   Wt Readings from Last 3 Encounters:  12/28/21 220 lb 12.8 oz (100.2 kg)  12/17/20 220 lb (99.8 kg)  12/11/20 223 lb 4 oz (101.3 kg)      Physical Exam Vitals and nursing note reviewed.  Constitutional:      Appearance: Normal appearance. He is not ill-appearing.  HENT:     Head: Normocephalic and atraumatic.     Mouth/Throat:     Mouth: Mucous membranes are moist.     Pharynx: Oropharynx is clear.  Eyes:     Extraocular Movements: Extraocular movements intact.     Pupils: Pupils are equal, round, and reactive to light.  Cardiovascular:     Rate and Rhythm: Normal rate and regular rhythm.     Pulses: Normal pulses.  Heart sounds: Normal heart sounds. No murmur heard. Pulmonary:     Effort: Pulmonary effort is normal. No respiratory distress.     Breath sounds: Normal breath sounds. No wheezing, rhonchi or rales.  Musculoskeletal:     Right lower leg: No edema.     Left lower leg: No edema.  Skin:    General: Skin is warm and dry.     Findings: No rash.  Neurological:     Mental Status: He is alert.  Psychiatric:        Mood and Affect: Mood normal.        Behavior: Behavior normal.       Lab Results  Component Value Date   CREATININE 1.30 (H) 12/17/2020   BUN 15 12/17/2020   NA 133 (L) 12/17/2020   K 4.2 12/17/2020   CL 100 12/17/2020   CO2 24 12/17/2020    Assessment & Plan:   Problem List Items Addressed This Visit      Hypertension - Primary    Chronic, deteriorated. Sounds like he missed several doses of antihypertensives as we last filled 6 month supply back in 12/2020 however he states more recently he's been consistently taking both amlodipine 5mg  and benazepril 10mg .  Will increase and simplify regimen to lotrel 5/20mg  daily.  RTC 1 wk after starting higher dose for Cr check lab visit.  Reassess control at 2 month CPE.       Relevant Medications   amLODipine-benazepril (LOTREL) 5-20 MG capsule   Obesity, Class I, BMI 30-34.9    Asks about recommendable diets - suggested mediterranean diet, handout provided.         Meds ordered this encounter  Medications   DISCONTD: amLODipine-benazepril (LOTREL) 5-20 MG capsule    Sig: Take 1 capsule by mouth daily.    Dispense:  90 capsule    Refill:  3   amLODipine-benazepril (LOTREL) 5-20 MG capsule    Sig: Take 1 capsule by mouth daily.    Dispense:  90 capsule    Refill:  3    To replace individual components   No orders of the defined types were placed in this encounter.   Patient instructions: Continue watching salt/sodium in the diet. Drink plenty of water New BP medicine to replace all old ones - combination Lotrel 5/20 (amlodipine 5mg , benazepril 20mg ) - take one tablet daily. Schedule lab visit in 1 week for kidney check Keep physical appointment.   Follow up plan: Return if symptoms worsen or fail to improve.  , MD

## 2021-12-28 NOTE — Patient Instructions (Addendum)
Continue watching salt/sodium in the diet. Drink plenty of water New BP medicine to replace all old ones - combination Lotrel 5/20 (amlodipine 5mg , benazepril 20mg ) - take one tablet daily. Schedule lab visit in 1 week for kidney check Keep physical appointment.       Mediterranean Diet  Why follow it? Research shows. Those who follow the Mediterranean diet have a reduced risk of heart disease  The diet is associated with a reduced incidence of Parkinson's and Alzheimer's diseases People following the diet may have longer life expectancies and lower rates of chronic diseases  The Dietary Guidelines for Americans recommends the Mediterranean diet as an eating plan to promote health and prevent disease  What Is the Mediterranean Diet?  Healthy eating plan based on typical foods and recipes of Mediterranean-style cooking The diet is primarily a plant based diet; these foods should make up a majority of meals   Starches - Plant based foods should make up a majority of meals - They are an important sources of vitamins, minerals, energy, antioxidants, and fiber - Choose whole grains, foods high in fiber and minimally processed items  - Typical grain sources include wheat, oats, barley, corn, brown rice, bulgar, farro, millet, polenta, couscous  - Various types of beans include chickpeas, lentils, fava beans, black beans, white beans   Fruits  Veggies - Large quantities of antioxidant rich fruits & veggies; 6 or more servings  - Vegetables can be eaten raw or lightly drizzled with oil and cooked  - Vegetables common to the traditional Mediterranean Diet include: artichokes, arugula, beets, broccoli, brussel sprouts, cabbage, carrots, celery, collard greens, cucumbers, eggplant, kale, leeks, lemons, lettuce, mushrooms, okra, onions, peas, peppers, potatoes, pumpkin, radishes, rutabaga, shallots, spinach, sweet potatoes, turnips, zucchini - Fruits common to the Mediterranean Diet include: apples,  apricots, avocados, cherries, clementines, dates, figs, grapefruits, grapes, melons, nectarines, oranges, peaches, pears, pomegranates, strawberries, tangerines  Fats - Replace butter and margarine with healthy oils, such as olive oil, canola oil, and tahini  - Limit nuts to no more than a handful a day  - Nuts include walnuts, almonds, pecans, pistachios, pine nuts  - Limit or avoid candied, honey roasted or heavily salted nuts - Olives are central to the - can be eaten whole or used in a variety of dishes   Meats Protein - Limiting red meat: no more than a few times a month - When eating red meat: choose lean cuts and keep the portion to the size of deck of cards - Eggs: approx. 0 to 4 times a week  - Fish and lean poultry: at least 2 a week  - Healthy protein sources include, chicken, , lean beef, lamb - Increase intake of seafood such as tuna, salmon, trout, mackerel, shrimp, scallops - Avoid or limit high fat processed meats such as sausage and bacon  Dairy - Include moderate amounts of low fat dairy products  - Focus on healthy dairy such as fat free yogurt, skim milk, low or reduced fat cheese - Limit dairy products higher in fat such as whole or 2% milk, cheese, ice cream  Alcohol - Moderate amounts of red wine is ok  - No more than 5 oz daily for women (all ages) and men older than age 81  - No more than 10 oz of wine daily for men younger than 22  Other - Limit sweets and other desserts  - Use herbs and spices instead of salt to flavor foods  -  Herbs and spices common to the traditional Mediterranean Diet include: basil, bay leaves, chives, cloves, cumin, fennel, garlic, lavender, marjoram, mint, oregano, parsley, pepper, rosemary, sage, savory, sumac, tarragon, thyme   It's not just a diet, it's a lifestyle:  The Mediterranean diet includes lifestyle factors typical of those in the region  Foods, drinks and meals are best eaten with others and  savored Daily physical activity is important for overall good health This could be strenuous exercise like running and aerobics This could also be more leisurely activities such as walking, housework, yard-work, or taking the stairs Moderation is the key; a balanced and healthy diet accommodates most foods and drinks Consider portion sizes and frequency of consumption of certain foods   Meal Ideas & Options:  Breakfast:  Whole wheat toast or whole wheat English muffins with peanut butter & hard boiled egg Steel cut oats topped with apples & cinnamon and skim milk  Fresh fruit: banana, strawberries, melon, berries, peaches  Smoothies: strawberries, bananas, greek yogurt, peanut butter Low fat greek yogurt with blueberries and granola  Egg white omelet with spinach and mushrooms Breakfast couscous: whole wheat couscous, apricots, skim milk, cranberries  Sandwiches:  Hummus and grilled vegetables (peppers, zucchini, squash) on whole wheat bread   Grilled chicken on whole wheat pita with lettuce, tomatoes, cucumbers or tzatziki  [place] salad on whole wheat bread: tuna salad made with greek yogurt, olives, red peppers, capers, green onions Garlic rosemary lamb pita: lamb sauted with garlic, rosemary, salt & pepper; add lettuce, cucumber, greek yogurt to pita - flavor with lemon juice and black pepper  Seafood:  Mediterranean grilled salmon, seasoned with garlic, basil, parsley, lemon juice and black pepper Shrimp, lemon, and spinach whole-grain pasta salad made with low fat greek yogurt  Seared scallops with lemon orzo  Seared tuna steaks seasoned salt, pepper, coriander topped with tomato mixture of olives, tomatoes, olive oil, minced garlic, parsley, green onions and cappers  Meats:  Herbed greek chicken salad with kalamata olives, cucumber, feta  Red bell peppers stuffed with spinach, bulgur, lean ground beef (or lentils) & topped with feta   Kebabs: skewers of chicken, tomatoes, onions,  zucchini, squash  [place] burgers: made with red onions, mint, dill, lemon juice, feta cheese topped with roasted red peppers Vegetarian Cucumber salad: cucumbers, artichoke hearts, celery, red onion, feta cheese, tossed in olive oil & lemon juice  Hummus and whole grain pita points with a greek salad (lettuce, tomato, feta, olives, cucumbers, red onion) Lentil soup with celery, carrots made with vegetable broth, garlic, salt and pepper  Tabouli salad: parsley, bulgur, mint, scallions, cucumbers, tomato, radishes, lemon juice, olive oil, salt and pepper.

## 2021-12-28 NOTE — Assessment & Plan Note (Signed)
Chronic, deteriorated. Sounds like he missed several doses of antihypertensives as we last filled 6 month supply back in 12/2020 however he states more recently he's been consistently taking both amlodipine 5mg  and benazepril 10mg .  Will increase and simplify regimen to lotrel 5/20mg  daily.  RTC 1 wk after starting higher dose for Cr check lab visit.  Reassess control at 2 month CPE.

## 2021-12-28 NOTE — Assessment & Plan Note (Signed)
Asks about recommendable diets - suggested mediterranean diet, handout provided.

## 2021-12-30 ENCOUNTER — Other Ambulatory Visit: Payer: Self-pay | Admitting: Family Medicine

## 2021-12-30 DIAGNOSIS — I10 Essential (primary) hypertension: Secondary | ICD-10-CM

## 2022-01-05 ENCOUNTER — Other Ambulatory Visit (INDEPENDENT_AMBULATORY_CARE_PROVIDER_SITE_OTHER): Payer: BC Managed Care – PPO

## 2022-01-05 DIAGNOSIS — I10 Essential (primary) hypertension: Secondary | ICD-10-CM | POA: Diagnosis not present

## 2022-01-05 LAB — BASIC METABOLIC PANEL
BUN: 17 mg/dL (ref 6–23)
CO2: 26 mEq/L (ref 19–32)
Calcium: 9.8 mg/dL (ref 8.4–10.5)
Chloride: 101 mEq/L (ref 96–112)
Creatinine, Ser: 0.96 mg/dL (ref 0.40–1.50)
GFR: 85.85 mL/min (ref >60.00)
Glucose, Bld: 100 mg/dL — ABNORMAL HIGH (ref 70–99)
Potassium: 4.4 mEq/L (ref 3.5–5.1)
Sodium: 138 mEq/L (ref 135–145)

## 2022-01-05 LAB — TSH: TSH: 1.82 u[IU]/mL (ref 0.35–5.50)

## 2022-02-06 ENCOUNTER — Other Ambulatory Visit: Payer: Self-pay | Admitting: Family Medicine

## 2022-02-06 DIAGNOSIS — R454 Irritability and anger: Secondary | ICD-10-CM

## 2022-02-13 ENCOUNTER — Other Ambulatory Visit: Payer: Self-pay | Admitting: Family Medicine

## 2022-02-13 DIAGNOSIS — E785 Hyperlipidemia, unspecified: Secondary | ICD-10-CM

## 2022-02-13 DIAGNOSIS — R7303 Prediabetes: Secondary | ICD-10-CM

## 2022-02-13 DIAGNOSIS — Z125 Encounter for screening for malignant neoplasm of prostate: Secondary | ICD-10-CM

## 2022-02-16 ENCOUNTER — Other Ambulatory Visit (INDEPENDENT_AMBULATORY_CARE_PROVIDER_SITE_OTHER): Payer: BC Managed Care – PPO

## 2022-02-16 DIAGNOSIS — E785 Hyperlipidemia, unspecified: Secondary | ICD-10-CM

## 2022-02-16 DIAGNOSIS — Z125 Encounter for screening for malignant neoplasm of prostate: Secondary | ICD-10-CM | POA: Diagnosis not present

## 2022-02-16 DIAGNOSIS — R7303 Prediabetes: Secondary | ICD-10-CM | POA: Diagnosis not present

## 2022-02-16 LAB — PSA: PSA: 0.49 ng/mL (ref 0.10–4.00)

## 2022-02-16 LAB — COMPREHENSIVE METABOLIC PANEL
ALT: 20 U/L (ref 0–53)
AST: 17 U/L (ref 0–37)
Albumin: 4.1 g/dL (ref 3.5–5.2)
Alkaline Phosphatase: 61 U/L (ref 39–117)
BUN: 12 mg/dL (ref 6–23)
CO2: 27 mEq/L (ref 19–32)
Calcium: 9.5 mg/dL (ref 8.4–10.5)
Chloride: 106 mEq/L (ref 96–112)
Creatinine, Ser: 0.92 mg/dL (ref 0.40–1.50)
GFR: 90.27 mL/min (ref >60.00)
Glucose, Bld: 104 mg/dL — ABNORMAL HIGH (ref 70–99)
Potassium: 4.3 mEq/L (ref 3.5–5.1)
Sodium: 140 mEq/L (ref 135–145)
Total Bilirubin: 0.5 mg/dL (ref 0.2–1.2)
Total Protein: 6.5 g/dL (ref 6.0–8.3)

## 2022-02-16 LAB — LIPID PANEL
Cholesterol: 157 mg/dL (ref 0–200)
HDL: 34.3 mg/dL — ABNORMAL LOW (ref >39.00)
LDL Cholesterol: 96 mg/dL (ref 0–99)
NonHDL: 122.42
Total CHOL/HDL Ratio: 5
Triglycerides: 130 mg/dL (ref 0.0–149.0)
VLDL: 26 mg/dL (ref 0.0–40.0)

## 2022-02-16 LAB — HEMOGLOBIN A1C: Hgb A1c MFr Bld: 5.9 % (ref 4.6–6.5)

## 2022-02-23 ENCOUNTER — Ambulatory Visit (INDEPENDENT_AMBULATORY_CARE_PROVIDER_SITE_OTHER): Payer: BC Managed Care – PPO | Admitting: Family Medicine

## 2022-02-23 ENCOUNTER — Encounter: Payer: Self-pay | Admitting: Family Medicine

## 2022-02-23 VITALS — BP 130/84 | HR 73 | Temp 97.6°F | Ht 67.25 in | Wt 202.1 lb

## 2022-02-23 DIAGNOSIS — Z7189 Other specified counseling: Secondary | ICD-10-CM

## 2022-02-23 DIAGNOSIS — R454 Irritability and anger: Secondary | ICD-10-CM | POA: Diagnosis not present

## 2022-02-23 DIAGNOSIS — R7303 Prediabetes: Secondary | ICD-10-CM

## 2022-02-23 DIAGNOSIS — I10 Essential (primary) hypertension: Secondary | ICD-10-CM

## 2022-02-23 DIAGNOSIS — Z0000 Encounter for general adult medical examination without abnormal findings: Secondary | ICD-10-CM | POA: Diagnosis not present

## 2022-02-23 DIAGNOSIS — N529 Male erectile dysfunction, unspecified: Secondary | ICD-10-CM

## 2022-02-23 DIAGNOSIS — Z1211 Encounter for screening for malignant neoplasm of colon: Secondary | ICD-10-CM

## 2022-02-23 DIAGNOSIS — E669 Obesity, unspecified: Secondary | ICD-10-CM

## 2022-02-23 DIAGNOSIS — E785 Hyperlipidemia, unspecified: Secondary | ICD-10-CM

## 2022-02-23 MED ORDER — AMLODIPINE BESY-BENAZEPRIL HCL 5-20 MG PO CAPS: 1.0000 | ORAL_CAPSULE | Freq: Every day | ORAL | 4 refills | Status: DC

## 2022-02-23 MED ORDER — SILDENAFIL CITRATE 20 MG PO TABS: 60.0000 mg | ORAL_TABLET | Freq: Every day | ORAL | 6 refills | Status: DC | PRN

## 2022-02-23 MED ORDER — SERTRALINE HCL 50 MG PO TABS: ORAL_TABLET | ORAL | 4 refills | Status: DC

## 2022-02-23 NOTE — Assessment & Plan Note (Signed)
Marked improvement since starting mediterranean diet - congratulated, continue this. The 10-year ASCVD risk score (Arnett DK, et al., 2019) is: 10.8%   Values used to calculate the score:     Age: 62 years     Sex: Male     Is Non-Hispanic African American: No     Diabetic: No     Tobacco smoker: No     Systolic Blood Pressure: [id number] mmHg     Is BP treated: Yes     HDL Cholesterol: 34.3 mg/dL     Total Cholesterol: 157 mg/dL

## 2022-02-23 NOTE — Assessment & Plan Note (Signed)
Reviewed limiting added sugar in diet.

## 2022-02-23 NOTE — Assessment & Plan Note (Signed)
Advanced directive - discussed. Doesn't have this set up yet. Wife is HCPOA. Full code. No prolonged life support if terminal condition. Handout provided today.

## 2022-02-23 NOTE — Assessment & Plan Note (Signed)
Doing well on sertraline 57m daily - continue this.

## 2022-02-23 NOTE — Assessment & Plan Note (Signed)
Congratulated on weight loss to date - continue mediterranean diet.

## 2022-02-23 NOTE — Patient Instructions (Addendum)
Pass by lab for stool kit. Congratulations on healthy diet and lifestyle changes and subsequent 18 lb weight loss! Keep it up!  Cholesterol levels are much improved! Advanced directive packet provided today.  Return as needed or in 1 year for next physical.

## 2022-02-23 NOTE — Assessment & Plan Note (Signed)
Refill generic sildenafil which is effective.

## 2022-02-23 NOTE — Assessment & Plan Note (Signed)
Chronic, marked improvement on current regimen of lotrel 5/20 and mediterranean diet. Continue this.

## 2022-02-23 NOTE — Assessment & Plan Note (Signed)
Preventative protocols reviewed and updated unless pt declined. Discussed healthy diet and lifestyle.  

## 2022-02-23 NOTE — Progress Notes (Signed)
Patient ID: Samuel Harrington, male    DOB: 08/18/1960, 62 y.o.   MRN: [id number]  This visit was conducted in person.  BP 130/84   Pulse 73   Temp 97.6 F (36.4 C) (Temporal)   Ht 5' 7.25" (1.708 m)   Wt 202 lb 2 oz (91.7 kg)   SpO2 95%   BMI 31.42 kg/m   BP Readings from Last 3 Encounters:  02/23/22 130/84  12/28/21 (!) 158/100  12/17/20 132/84    CC: CPE Subjective:   HPI: Samuel Harrington is a 62 y.o. male presenting on 02/23/2022 for Annual Exam   HTN - last visit we increased antihypertensive to lotrel 5/54m daily.  18 lb weight loss in the past 2 months!  He is following mediterranean diet - more fish, chicken, beans and grain, has cut out sodas and red meat, no alcohol, no cigars.   Preventative: Colon cancer screening - iFOB negative 03/2020. Rpt today.  Prostate cancer screening - nocturia x1. No fmhx. PSA yearly Lung cancer - not eligible Flu shot - declines  COVID vaccine Moderna 09/2019, 11/2019  Tdap 2019  Shingrix discussed, declines Advanced directive - discussed. Doesn't have this set up yet. Wife is HCPOA. Full code. No prolonged life support if terminal condition. Handout provided today.  Seat belt use discussed  Sunscreen use discussed. No changing moles on skin.  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 Interim medical history since our last visit reviewed. Allergies and medications reviewed and updated. Outpatient Medications Prior to Visit  Medication Sig Dispense Refill   amLODipine-benazepril (LOTREL) 5-20 MG capsule Take 1 capsule by mouth daily. 90 capsule 3    sertraline (ZOLOFT) 50 MG tablet TAKE 1 AND 1/2 TABLET BY MOUTH EVERY NIGHT AT BEDTIME 135 tablet 0   sildenafil (REVATIO) 20 MG tablet Take 3-5 tablets (60-100 mg total) by mouth daily as needed (relations). 30 tablet 11   No facility-administered medications prior to visit.     Per HPI unless specifically indicated in ROS section below Review of Systems  Constitutional:  Negative for activity change, appetite change, chills, fatigue, fever and unexpected weight change.  HENT:  Negative for hearing loss.   Eyes:  Negative for visual disturbance.  Respiratory:  Negative for cough, chest tightness, shortness of breath and wheezing.   Cardiovascular:  Negative for chest pain, palpitations and leg swelling.  Gastrointestinal:  Negative for abdominal distention, abdominal pain, blood in stool, constipation, diarrhea, nausea and vomiting.  Genitourinary:  Negative for difficulty urinating and hematuria.  Musculoskeletal:  Negative for arthralgias, myalgias and neck pain.  Skin:  Negative for rash.  Neurological:  Negative for dizziness, seizures, syncope and headaches.  Hematological:  Negative for adenopathy. Does not bruise/bleed easily.  Psychiatric/Behavioral:  Negative for dysphoric mood. The patient is not nervous/anxious.     Objective:  BP 130/84   Pulse 73   Temp 97.6 F (36.4 C) (Temporal)   Ht 5' 7.25" (1.708 m)   Wt 202 lb 2 oz (91.7 kg)   SpO2 95%   BMI 31.42 kg/m   Wt Readings from  Last 3 Encounters:  02/23/22 202 lb 2 oz (91.7 kg)  12/28/21 220 lb 12.8 oz (100.2 kg)  12/17/20 220 lb (99.8 kg)      Physical Exam Vitals and nursing note reviewed.  Constitutional:      General: He is not in acute distress.    Appearance: Normal appearance. He is well-developed. He is not ill-appearing.  HENT:     Head: Normocephalic and atraumatic.     Right Ear: Hearing, tympanic membrane, ear canal and external ear normal.     Left Ear: Hearing, tympanic membrane, ear canal and  external ear normal.     Mouth/Throat:     Comments: Wearing mask Eyes:     General: No scleral icterus.    Extraocular Movements: Extraocular movements intact.     Conjunctiva/sclera: Conjunctivae normal.     Pupils: Pupils are equal, round, and reactive to light.  Neck:     Thyroid: No thyroid mass or thyromegaly.  Cardiovascular:     Rate and Rhythm: Normal rate and regular rhythm.     Pulses: Normal pulses.          Radial pulses are 2+ on the right side and 2+ on the left side.     Heart sounds: Normal heart sounds. No murmur heard. Pulmonary:     Effort: Pulmonary effort is normal. No respiratory distress.     Breath sounds: Normal breath sounds. No wheezing, rhonchi or rales.  Abdominal:     General: Bowel sounds are normal. There is no distension.     Palpations: Abdomen is soft. There is no mass.     Tenderness: There is no abdominal tenderness. There is no guarding or rebound.     Hernia: No hernia is present.  Musculoskeletal:        General: Normal range of motion.     Cervical back: Normal range of motion and neck supple.     Right lower leg: No edema.     Left lower leg: No edema.     Comments: Thoracic scoliosis, kyphosis present  Lymphadenopathy:     Cervical: No cervical adenopathy.  Skin:    General: Skin is warm and dry.     Findings: No rash.  Neurological:     General: No focal deficit present.     Mental Status: He is alert and oriented to person, place, and time.  Psychiatric:        Mood and Affect: Mood normal.        Behavior: Behavior normal.        Thought Content: Thought content normal.        Judgment: Judgment normal.       Results for orders placed or performed in visit on 02/16/22  Hemoglobin A1c  Result Value Ref Range   Hgb A1c MFr Bld 5.9 4.6 - 6.5 %  PSA  Result Value Ref Range   PSA 0.49 0.10 - 4.00 ng/mL  Comprehensive metabolic panel  Result Value Ref Range   Sodium 140 135 - 145 mEq/L   Potassium 4.3 3.5 - 5.1 mEq/L    Chloride 106 96 - 112 mEq/L   CO2 27 19 - 32 mEq/L   Glucose, Bld 104 (H) 70 - 99 mg/dL   BUN 12 6 - 23 mg/dL   Creatinine, Ser 0.92 0.40 - 1.50 mg/dL   Total Bilirubin 0.5 0.2 - 1.2 mg/dL   Alkaline Phosphatase 61 39 - 117 U/L   AST 17 0 - 37  U/L   ALT 20 0 - 53 U/L   Total Protein 6.5 6.0 - 8.3 g/dL   Albumin 4.1 3.5 - 5.2 g/dL   GFR 90.27 >60.00 mL/min   Calcium 9.5 8.4 - 10.5 mg/dL  Lipid panel  Result Value Ref Range   Cholesterol 157 0 - 200 mg/dL   Triglycerides 130.0 0.0 - 149.0 mg/dL   HDL 34.30 (L) >39.00 mg/dL   VLDL 26.0 0.0 - 40.0 mg/dL   LDL Cholesterol 96 0 - 99 mg/dL   Total CHOL/HDL Ratio 5    NonHDL 122.42       02/23/2022   10:05 AM 03/18/2020    8:46 AM 03/18/2020    8:16 AM 11/16/2018    8:08 AM 09/12/2017   10:40 AM  Depression screen PHQ 2/9  Decreased Interest 0 0 0 0 0  Down, Depressed, Hopeless 0 0 0 0 0  PHQ - 2 Score 0 0 0 0 0  Altered sleeping  0  1   Tired, decreased energy  0  0   Change in appetite  0  0   Feeling bad or failure about yourself   0  0   Trouble concentrating  0  0   Moving slowly or fidgety/restless  0  0   Suicidal thoughts  0  0   PHQ-9 Score  0  1    Assessment & Plan:   Problem List Items Addressed This Visit     Healthcare maintenance - Primary (Chronic)    Preventative protocols reviewed and updated unless pt declined. Discussed healthy diet and lifestyle.       Advanced directives, counseling/discussion (Chronic)    Advanced directive - discussed. Doesn't have this set up yet. Wife is HCPOA. Full code. No prolonged life support if terminal condition. Handout provided today.       Irritability and anger    Doing well on sertraline 30m daily - continue this.       Relevant Medications   sertraline (ZOLOFT) 50 MG tablet   Hypertension    Chronic, marked improvement on current regimen of lotrel 5/20 and mediterranean diet. Continue this.       Relevant Medications   amLODipine-benazepril (LOTREL) 5-20  MG capsule   sildenafil (REVATIO) 20 MG tablet   ERECTILE DYSFUNCTION, ORGANIC    Refill generic sildenafil which is effective.       Prediabetes    Reviewed limiting added sugar in diet.       Dyslipidemia    Marked improvement since starting mediterranean diet - congratulated, continue this. The 10-year ASCVD risk score (Arnett DK, et al., 2019) is: 10.8%   Values used to calculate the score:     Age: [age]years     Sex: Male     Is Non-Hispanic African American: No     Diabetic: No     Tobacco smoker: No     Systolic Blood Pressure: [id number]     Is BP treated: Yes     HDL Cholesterol: 34.3 mg/dL     Total Cholesterol: 157 mg/dL       Obesity, Class I, BMI 30-34.9    Congratulated on weight loss to date - continue mediterranean diet.       Other Visit Diagnoses     Special screening for malignant neoplasms, colon       Relevant Orders   Fecal occult blood, imunochemical        Meds ordered this encounter  Medications   amLODipine-benazepril (LOTREL) 5-20 MG capsule    Sig: Take 1 capsule by mouth daily.    Dispense:  90 capsule    Refill:  4   sertraline (ZOLOFT) 50 MG tablet    Sig: TAKE 1 AND 1/2 TABLET BY MOUTH EVERY NIGHT AT BEDTIME    Dispense:  135 tablet    Refill:  4   sildenafil (REVATIO) 20 MG tablet    Sig: Take 3-5 tablets (60-100 mg total) by mouth daily as needed (relations).    Dispense:  30 tablet    Refill:  6    Orders Placed This Encounter  Procedures   Fecal occult blood, imunochemical    Standing Status:   Future    Standing Expiration Date:   02/24/2023    Patient Instructions  Pass by lab for stool kit. Congratulations on healthy diet and lifestyle changes and subsequent 18 lb weight loss! Keep it up!  Cholesterol levels are much improved! Advanced directive packet provided today.  Return as needed or in 1 year for next physical.   Follow up plan: Return in about 1 year (around 02/24/2023) for annual exam, prior fasting for  blood work.  Ria Bush, MD

## 2023-02-28 ENCOUNTER — Other Ambulatory Visit: Payer: Self-pay | Admitting: Family Medicine

## 2023-02-28 DIAGNOSIS — I10 Essential (primary) hypertension: Secondary | ICD-10-CM

## 2023-02-28 NOTE — Telephone Encounter (Signed)
 Patient has been scheduled

## 2023-02-28 NOTE — Telephone Encounter (Signed)
 Noted.

## 2023-02-28 NOTE — Telephone Encounter (Signed)
 E-scribed refill.  Plz schedule CPE and fasting lab (no food/drink- except water and/or blk coffee 5 hrs prior) visits for additional refills.

## 2023-03-05 ENCOUNTER — Other Ambulatory Visit: Payer: Self-pay | Admitting: Family Medicine

## 2023-03-05 DIAGNOSIS — R454 Irritability and anger: Secondary | ICD-10-CM

## 2023-04-23 ENCOUNTER — Other Ambulatory Visit: Payer: Self-pay | Admitting: Family Medicine

## 2023-04-23 DIAGNOSIS — Z125 Encounter for screening for malignant neoplasm of prostate: Secondary | ICD-10-CM

## 2023-04-23 DIAGNOSIS — E785 Hyperlipidemia, unspecified: Secondary | ICD-10-CM

## 2023-04-23 DIAGNOSIS — R7303 Prediabetes: Secondary | ICD-10-CM

## 2023-04-28 ENCOUNTER — Other Ambulatory Visit (INDEPENDENT_AMBULATORY_CARE_PROVIDER_SITE_OTHER): Payer: BC Managed Care – PPO

## 2023-04-28 DIAGNOSIS — E785 Hyperlipidemia, unspecified: Secondary | ICD-10-CM

## 2023-04-28 DIAGNOSIS — Z125 Encounter for screening for malignant neoplasm of prostate: Secondary | ICD-10-CM | POA: Diagnosis not present

## 2023-04-28 DIAGNOSIS — R7303 Prediabetes: Secondary | ICD-10-CM

## 2023-04-28 LAB — COMPREHENSIVE METABOLIC PANEL WITH GFR
ALT: 25 U/L (ref 0–53)
AST: 19 U/L (ref 0–37)
Albumin: 4.1 g/dL (ref 3.5–5.2)
Alkaline Phosphatase: 58 U/L (ref 39–117)
BUN: 10 mg/dL (ref 6–23)
CO2: 28 meq/L (ref 19–32)
Calcium: 8.9 mg/dL (ref 8.4–10.5)
Chloride: 104 meq/L (ref 96–112)
Creatinine, Ser: 0.93 mg/dL (ref 0.40–1.50)
GFR: 88.37 mL/min (ref >60.00)
Glucose, Bld: 108 mg/dL — ABNORMAL HIGH (ref 70–99)
Potassium: 4.3 meq/L (ref 3.5–5.1)
Sodium: 140 meq/L (ref 135–145)
Total Bilirubin: 0.7 mg/dL (ref 0.2–1.2)
Total Protein: 6.4 g/dL (ref 6.0–8.3)

## 2023-04-28 LAB — LIPID PANEL
Cholesterol: 197 mg/dL (ref 0–200)
HDL: 32.8 mg/dL — ABNORMAL LOW (ref >39.00)
LDL Cholesterol: 124 mg/dL — ABNORMAL HIGH (ref 0–99)
NonHDL: 164.35
Total CHOL/HDL Ratio: 6
Triglycerides: 200 mg/dL — ABNORMAL HIGH (ref 0.0–149.0)
VLDL: 40 mg/dL (ref 0.0–40.0)

## 2023-04-28 LAB — HEMOGLOBIN A1C: Hgb A1c MFr Bld: 6.4 % (ref 4.6–6.5)

## 2023-04-28 LAB — PSA: PSA: 0.69 ng/mL (ref 0.10–4.00)

## 2023-05-05 ENCOUNTER — Encounter: Payer: BC Managed Care – PPO | Admitting: Family Medicine

## 2023-06-07 ENCOUNTER — Other Ambulatory Visit: Payer: Self-pay | Admitting: Family Medicine

## 2023-06-07 DIAGNOSIS — R454 Irritability and anger: Secondary | ICD-10-CM

## 2023-06-07 DIAGNOSIS — I10 Essential (primary) hypertension: Secondary | ICD-10-CM

## 2023-07-24 ENCOUNTER — Other Ambulatory Visit: Payer: Self-pay | Admitting: Family Medicine

## 2023-07-24 DIAGNOSIS — R454 Irritability and anger: Secondary | ICD-10-CM

## 2023-08-06 ENCOUNTER — Other Ambulatory Visit: Payer: Self-pay | Admitting: Family Medicine

## 2023-08-06 DIAGNOSIS — Z125 Encounter for screening for malignant neoplasm of prostate: Secondary | ICD-10-CM

## 2023-08-06 DIAGNOSIS — R7303 Prediabetes: Secondary | ICD-10-CM

## 2023-08-06 DIAGNOSIS — E785 Hyperlipidemia, unspecified: Secondary | ICD-10-CM

## 2023-08-08 ENCOUNTER — Other Ambulatory Visit

## 2023-08-16 ENCOUNTER — Encounter: Payer: Self-pay | Admitting: Family Medicine

## 2023-08-16 ENCOUNTER — Ambulatory Visit: Admitting: Family Medicine

## 2023-08-16 VITALS — BP 138/74 | HR 82 | Temp 98.2°F | Ht 67.25 in | Wt 220.2 lb

## 2023-08-16 DIAGNOSIS — I10 Essential (primary) hypertension: Secondary | ICD-10-CM

## 2023-08-16 DIAGNOSIS — R454 Irritability and anger: Secondary | ICD-10-CM | POA: Diagnosis not present

## 2023-08-16 DIAGNOSIS — E785 Hyperlipidemia, unspecified: Secondary | ICD-10-CM

## 2023-08-16 DIAGNOSIS — S22009S Unspecified fracture of unspecified thoracic vertebra, sequela: Secondary | ICD-10-CM

## 2023-08-16 DIAGNOSIS — Z0000 Encounter for general adult medical examination without abnormal findings: Secondary | ICD-10-CM | POA: Diagnosis not present

## 2023-08-16 DIAGNOSIS — M419 Scoliosis, unspecified: Secondary | ICD-10-CM

## 2023-08-16 DIAGNOSIS — N529 Male erectile dysfunction, unspecified: Secondary | ICD-10-CM

## 2023-08-16 DIAGNOSIS — K429 Umbilical hernia without obstruction or gangrene: Secondary | ICD-10-CM

## 2023-08-16 DIAGNOSIS — R7303 Prediabetes: Secondary | ICD-10-CM

## 2023-08-16 DIAGNOSIS — S22009A Unspecified fracture of unspecified thoracic vertebra, initial encounter for closed fracture: Secondary | ICD-10-CM | POA: Insufficient documentation

## 2023-08-16 DIAGNOSIS — Z1211 Encounter for screening for malignant neoplasm of colon: Secondary | ICD-10-CM | POA: Diagnosis not present

## 2023-08-16 DIAGNOSIS — E66811 Obesity, class 1: Secondary | ICD-10-CM

## 2023-08-16 MED ORDER — AMLODIPINE BESY-BENAZEPRIL HCL 5-20 MG PO CAPS: 1.0000 | ORAL_CAPSULE | Freq: Every day | ORAL | 3 refills | Status: AC

## 2023-08-16 MED ORDER — SERTRALINE HCL 50 MG PO TABS: ORAL_TABLET | ORAL | 3 refills | Status: AC

## 2023-08-16 MED ORDER — SILDENAFIL CITRATE 20 MG PO TABS: 60.0000 mg | ORAL_TABLET | Freq: Every day | ORAL | 6 refills | Status: AC | PRN

## 2023-08-16 NOTE — Assessment & Plan Note (Signed)
 Deteriorated with A1c up to 6.4% - he will cut out sweetened beverages.

## 2023-08-16 NOTE — Assessment & Plan Note (Signed)
 Discussed weight gain noted.  Restart mediterranean diet.

## 2023-08-16 NOTE — Assessment & Plan Note (Signed)
 Longstanding history of this - even in childhood.
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
## 2023-08-16 NOTE — Assessment & Plan Note (Signed)
 Chronic, deteriorated in setting of weight gain. Encouraged renewed efforts to follow mediterranean diet.  Consider cardiac CT - will discuss at next visit.  The 10-year ASCVD risk score (Arnett DK, et al., 2019) is: 17.2%   Values used to calculate the score:     Age: 63 years     Clincally relevant sex: Male     Is Non-Hispanic African American: No     Diabetic: No     Tobacco smoker: No     Systolic Blood Pressure: 138 mmHg     Is BP treated: Yes     HDL Cholesterol: 32.8 mg/dL     Total Cholesterol: 197 mg/dL

## 2023-08-16 NOTE — Assessment & Plan Note (Addendum)
 Preventative protocols reviewed and updated unless pt declined. Discussed healthy diet and lifestyle.  Declines vaccines.
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
## 2023-08-16 NOTE — Assessment & Plan Note (Signed)
 Chronic, stable. Continue current regimen.

## 2023-08-16 NOTE — Assessment & Plan Note (Signed)
 Chronic, stable on sertraline  75mg  daily - continue.

## 2023-08-16 NOTE — Assessment & Plan Note (Signed)
 Refill generic sildenafil  which will be self- pay

## 2023-08-16 NOTE — Patient Instructions (Addendum)
 Consider shingles and pneumonia shots.  Referral placed to [facility] for colonoscopy this year.  Sugar and cholesterol were worse this year - restart mediterranean diet  Good to see you today Return in 1 year for next physical

## 2023-08-16 NOTE — Assessment & Plan Note (Signed)
Small, asxs.

## 2023-11-28 IMAGING — CR DG CHEST 2V
1 series · 2 of 2 positions shown · non-contrast
Comparison: None.

CLINICAL DATA: Syncope

EXAM:
CHEST - 2 VIEW

[Series 1: dg chest 2 view · 0.14mm/px · 2 of 2 slices shown]
[im 1/2]
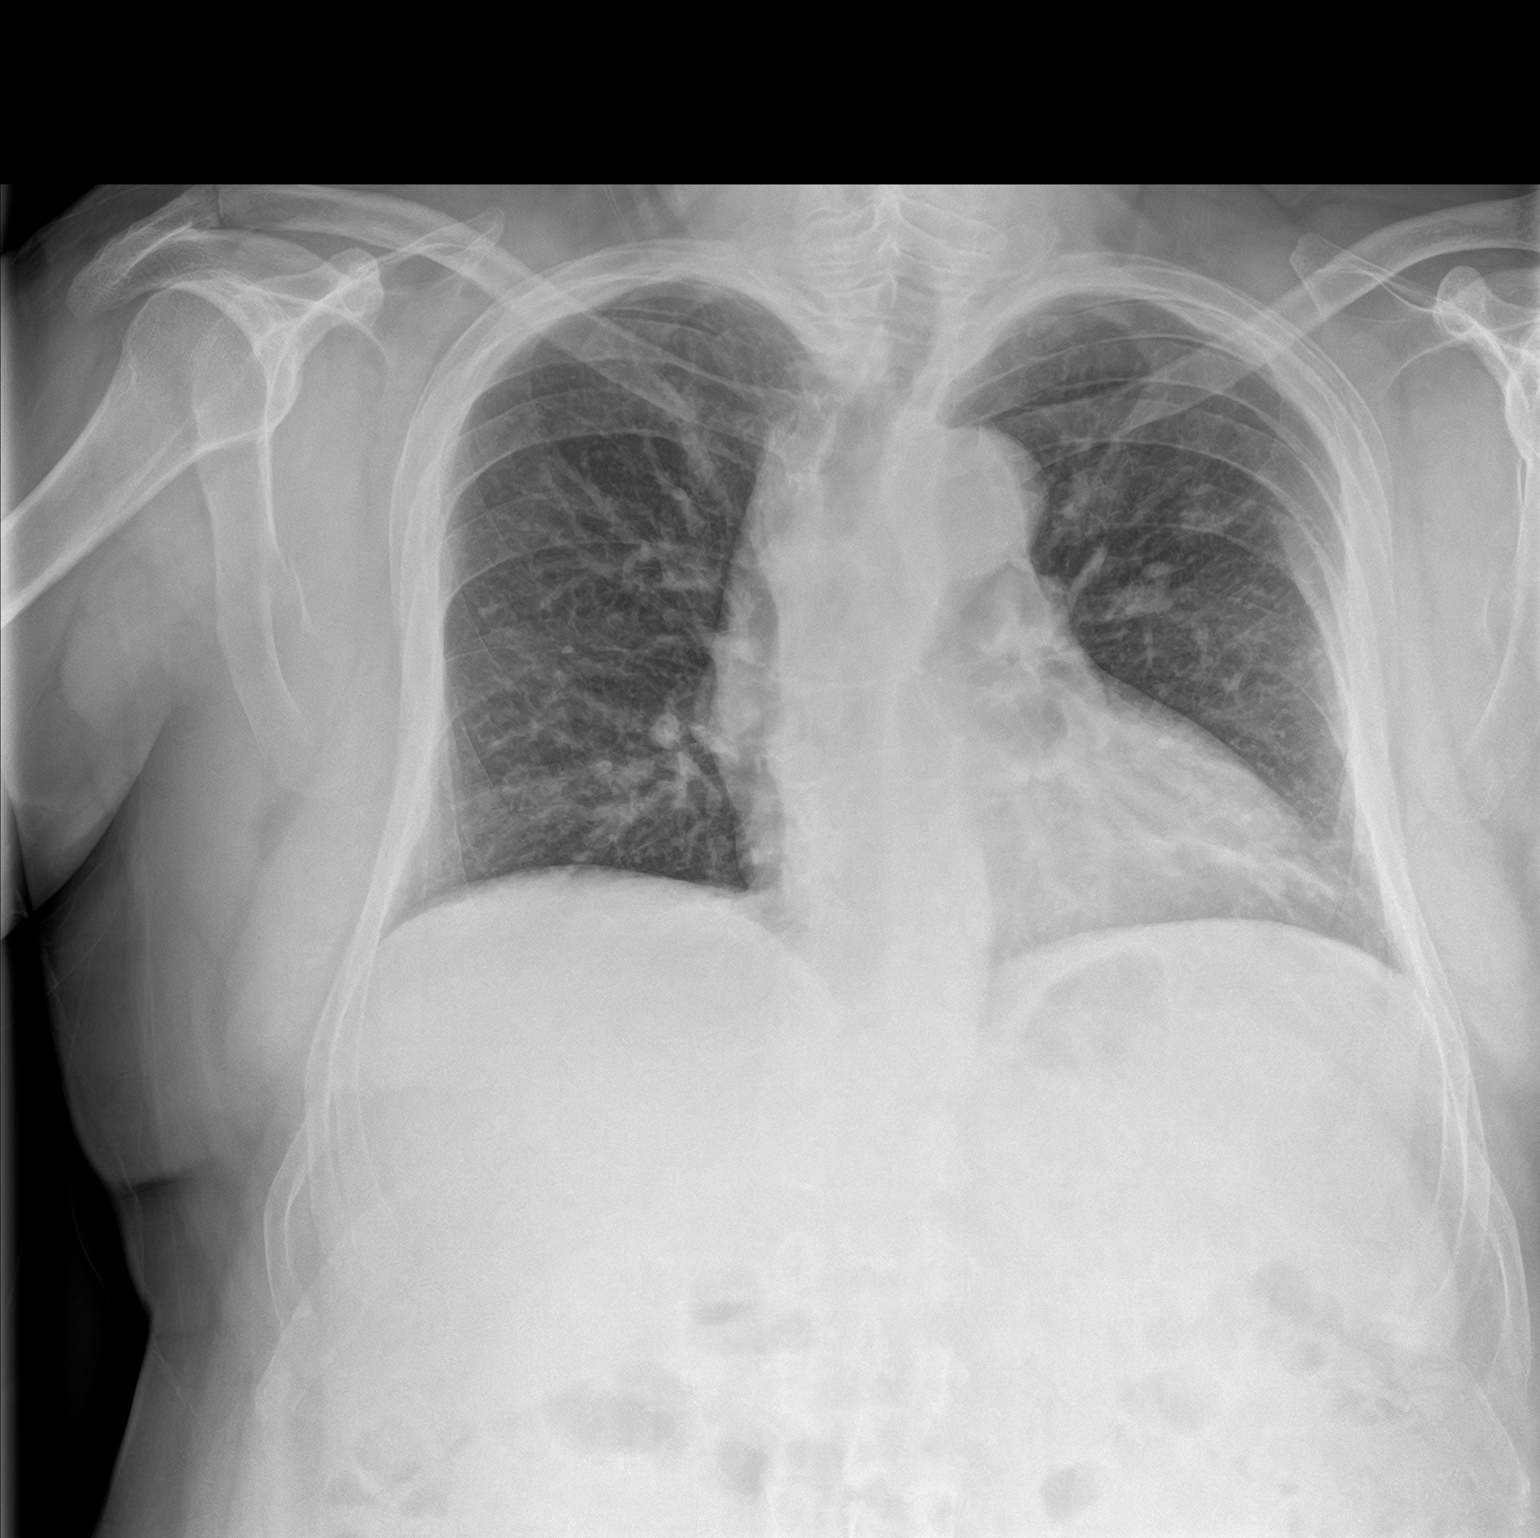
[im 2/2]
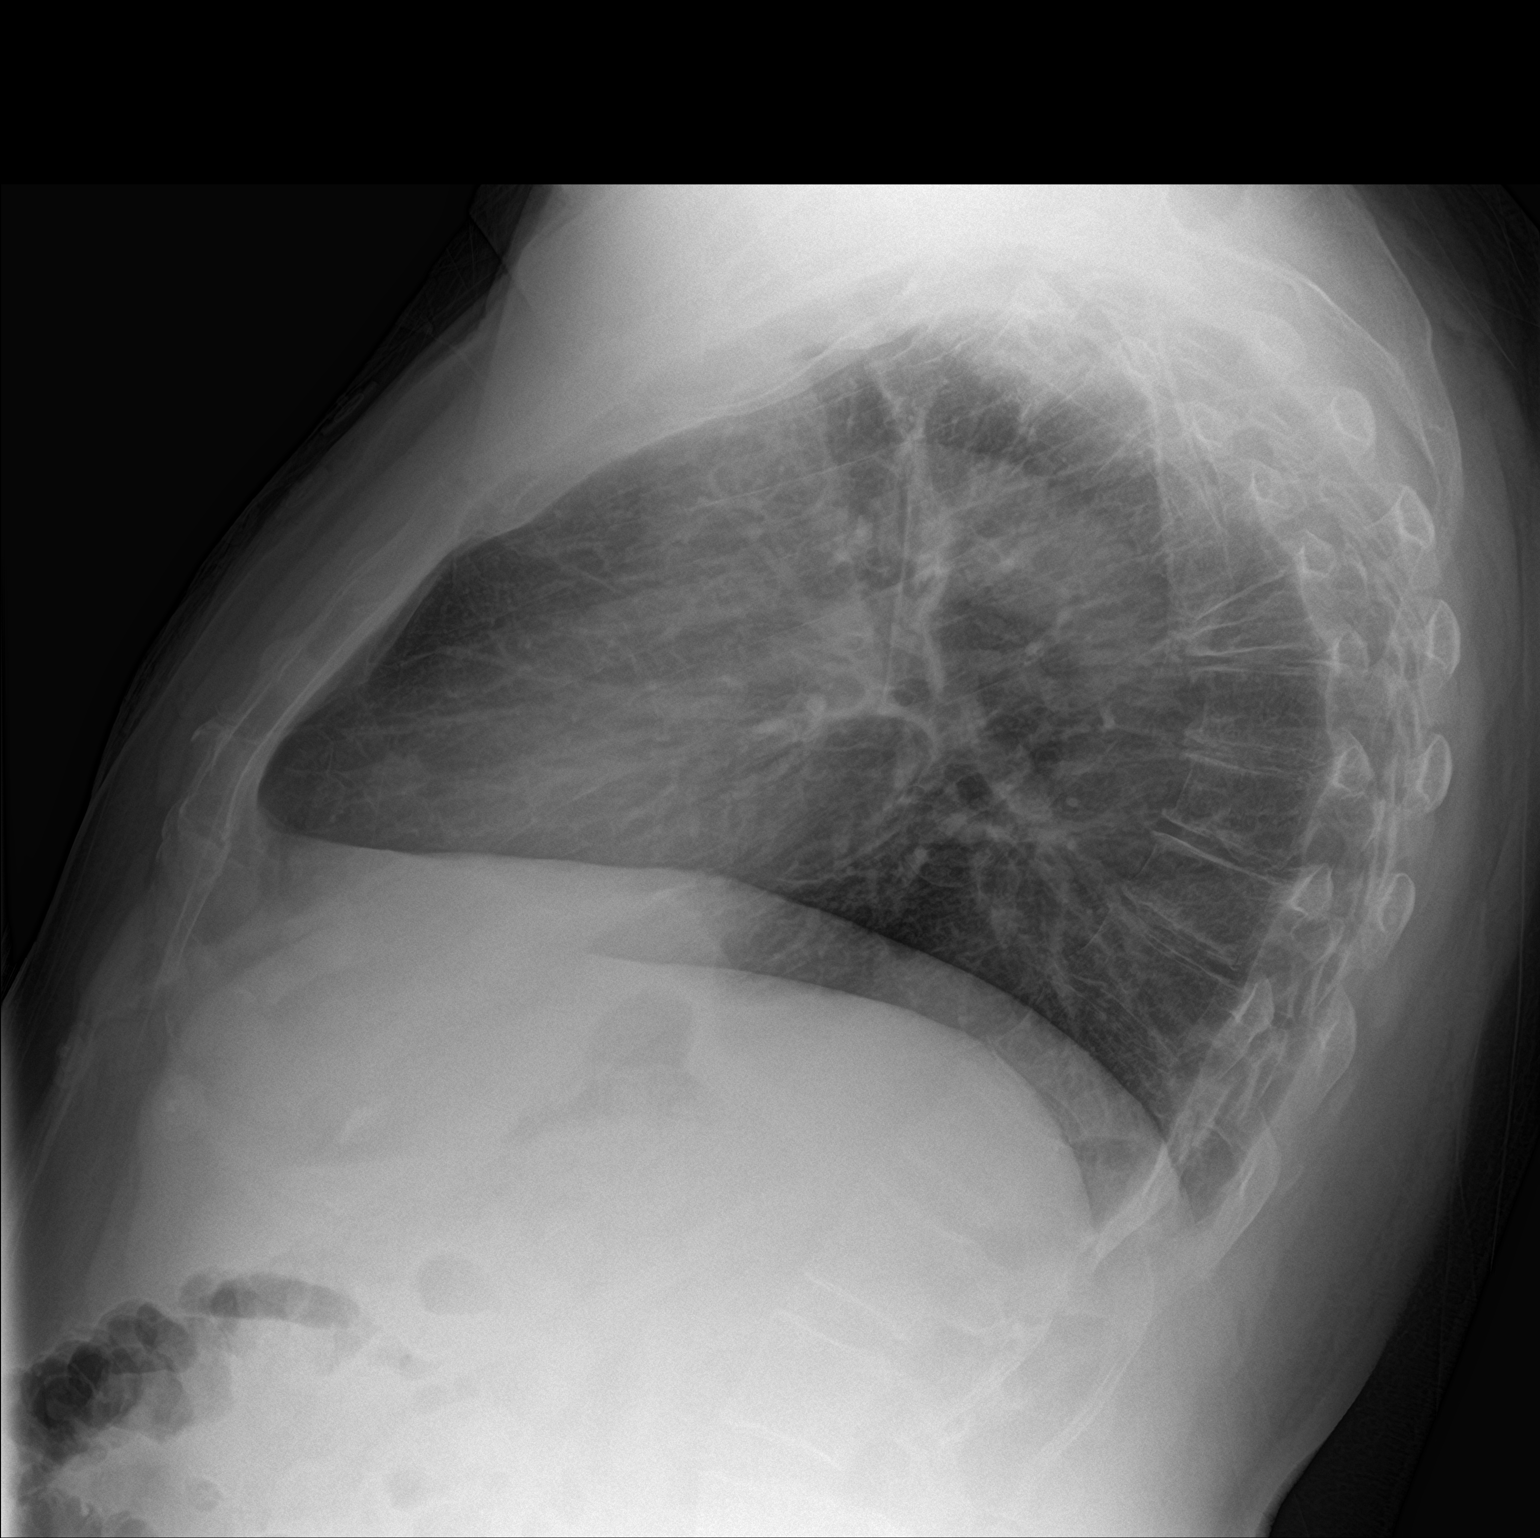

[2 of 2 positions shown; findings below may reference images not displayed]

FINDINGS: Cardiac and mediastinal contours are within normal limits. Mild left
basilar opacity, likely atelectasis. No pleural effusion or
pneumothorax. Severe age-indeterminate compression deformity of the
midthoracic spine.
IMPRESSION: 1. No active cardiopulmonary disease.
2. Severe age-indeterminate compression deformity of the midthoracic
spine, correlate for point tenderness.
